# Patient Record
Sex: Female | Born: 1985 | Race: White | Hispanic: No | Marital: Married | Smoking: Former smoker
Health system: Southern US, Community
[De-identification: ages and names within clinical notes are randomized; demographics above are authoritative.]

## PROBLEM LIST (undated history)

## (undated) ENCOUNTER — Inpatient Hospital Stay (HOSPITAL_COMMUNITY): Payer: Self-pay

## (undated) DIAGNOSIS — O34219 Maternal care for unspecified type scar from previous cesarean delivery: Secondary | ICD-10-CM

## (undated) DIAGNOSIS — K219 Gastro-esophageal reflux disease without esophagitis: Secondary | ICD-10-CM

## (undated) DIAGNOSIS — O42919 Preterm premature rupture of membranes, unspecified as to length of time between rupture and onset of labor, unspecified trimester: Secondary | ICD-10-CM

## (undated) DIAGNOSIS — T7840XA Allergy, unspecified, initial encounter: Secondary | ICD-10-CM

## (undated) DIAGNOSIS — O4590 Premature separation of placenta, unspecified, unspecified trimester: Secondary | ICD-10-CM

## (undated) DIAGNOSIS — Q792 Exomphalos: Secondary | ICD-10-CM

## (undated) DIAGNOSIS — O321XX0 Maternal care for breech presentation, not applicable or unspecified: Secondary | ICD-10-CM

## (undated) HISTORY — DX: Allergy, unspecified, initial encounter: T78.40XA

## (undated) HISTORY — DX: Gastro-esophageal reflux disease without esophagitis: K21.9

## (undated) HISTORY — PX: MOUTH SURGERY: SHX715

## (undated) HISTORY — PX: NO PAST SURGERIES: SHX2092

---
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
## 2004-01-14 ENCOUNTER — Other Ambulatory Visit: Admission: RE | Admit: 2004-01-14 | Discharge: 2004-01-14 | Payer: Self-pay | Admitting: Internal Medicine

## 2005-03-08 ENCOUNTER — Other Ambulatory Visit: Admission: RE | Admit: 2005-03-08 | Discharge: 2005-03-08 | Payer: Self-pay | Admitting: Internal Medicine

## 2006-03-10 ENCOUNTER — Other Ambulatory Visit: Admission: RE | Admit: 2006-03-10 | Discharge: 2006-03-10 | Payer: Self-pay | Admitting: Internal Medicine

## 2007-03-08 ENCOUNTER — Other Ambulatory Visit: Admission: RE | Admit: 2007-03-08 | Discharge: 2007-03-08 | Payer: Self-pay | Admitting: Internal Medicine

## 2009-10-15 ENCOUNTER — Encounter: Admission: RE | Admit: 2009-10-15 | Discharge: 2009-10-15 | Payer: Self-pay | Admitting: Internal Medicine

## 2009-10-16 ENCOUNTER — Other Ambulatory Visit: Admission: RE | Admit: 2009-10-16 | Discharge: 2009-10-16 | Payer: Self-pay | Admitting: Internal Medicine

## 2010-11-16 ENCOUNTER — Encounter: Payer: Self-pay | Admitting: Gastroenterology

## 2010-12-25 ENCOUNTER — Encounter: Payer: Self-pay | Admitting: Gastroenterology

## 2010-12-25 ENCOUNTER — Ambulatory Visit (INDEPENDENT_AMBULATORY_CARE_PROVIDER_SITE_OTHER): Payer: BC Managed Care – PPO | Admitting: Gastroenterology

## 2010-12-25 VITALS — BP 100/76 | HR 60 | Ht 62.0 in | Wt 112.6 lb

## 2010-12-25 DIAGNOSIS — K219 Gastro-esophageal reflux disease without esophagitis: Secondary | ICD-10-CM

## 2010-12-25 NOTE — Patient Instructions (Addendum)
Stay on dexilant, more samples given. Start pepcid or zantac at bedtime. You will be set up for an upper endoscopy. A copy of this information will be made available to Dr. Marisue Brooklyn.
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
## 2011-01-19 ENCOUNTER — Encounter: Payer: Self-pay | Admitting: Gastroenterology

## 2011-01-19 ENCOUNTER — Ambulatory Visit (AMBULATORY_SURGERY_CENTER): Payer: BC Managed Care – PPO | Admitting: Gastroenterology

## 2011-01-19 VITALS — BP 153/73 | HR 72 | Temp 97.8°F | Resp 16 | Ht 62.0 in | Wt 112.0 lb

## 2011-01-19 DIAGNOSIS — K2990 Gastroduodenitis, unspecified, without bleeding: Secondary | ICD-10-CM

## 2011-01-19 DIAGNOSIS — A048 Other specified bacterial intestinal infections: Secondary | ICD-10-CM

## 2011-01-19 DIAGNOSIS — K2940 Chronic atrophic gastritis without bleeding: Secondary | ICD-10-CM

## 2011-01-19 DIAGNOSIS — K219 Gastro-esophageal reflux disease without esophagitis: Secondary | ICD-10-CM

## 2011-01-19 DIAGNOSIS — K2970 Gastritis, unspecified, without bleeding: Secondary | ICD-10-CM

## 2011-01-19 MED ORDER — SODIUM CHLORIDE 0.9 % IV SOLN: 500.0000 mL | INTRAVENOUS | Status: DC

## 2011-01-19 NOTE — Patient Instructions (Signed)
Please review discharge instructions  Please continue Dexilant every morning.  Ok to stop nightly Pepcid/Zantac since you haven't noticed any difference

## 2011-01-20 ENCOUNTER — Telehealth: Payer: Self-pay | Admitting: *Deleted

## 2011-01-20 NOTE — Telephone Encounter (Signed)

## 2011-01-26 ENCOUNTER — Other Ambulatory Visit: Payer: Self-pay | Admitting: Gastroenterology

## 2011-01-26 DIAGNOSIS — A048 Other specified bacterial intestinal infections: Secondary | ICD-10-CM

## 2011-01-26 MED ORDER — AMOXICILL-CLARITHRO-LANSOPRAZ PO MISC: Freq: Two times a day (BID) | ORAL | Status: DC

## 2011-01-27 ENCOUNTER — Telehealth: Payer: Self-pay | Admitting: Gastroenterology

## 2011-01-27 MED ORDER — CLARITHROMYCIN 500 MG PO TABS: 500.0000 mg | ORAL_TABLET | Freq: Two times a day (BID) | ORAL | Status: AC

## 2011-01-27 MED ORDER — AMOXICILLIN 500 MG PO CAPS: 1000.0000 mg | ORAL_CAPSULE | Freq: Two times a day (BID) | ORAL | Status: AC

## 2011-01-27 NOTE — Telephone Encounter (Signed)
Great, thanks

## 2011-01-27 NOTE — Telephone Encounter (Signed)
Pt needs to have alternative to prev pac because of cost, 500 mg biaxin and 1000 mg amox both bid for ten days she will also take dexilant as well.  Allergies verified sent to pharmacy

## 2011-03-16 ENCOUNTER — Ambulatory Visit: Payer: BC Managed Care – PPO | Admitting: Gastroenterology

## 2011-11-17 ENCOUNTER — Other Ambulatory Visit: Payer: Self-pay

## 2011-11-17 ENCOUNTER — Other Ambulatory Visit (HOSPITAL_COMMUNITY)
Admission: RE | Admit: 2011-11-17 | Discharge: 2011-11-17 | Disposition: A | Discharge status: Home / Self Care | Payer: BC Managed Care – PPO | Source: Ambulatory Visit | Attending: Internal Medicine | Admitting: Internal Medicine

## 2011-11-17 DIAGNOSIS — Z01419 Encounter for gynecological examination (general) (routine) without abnormal findings: Secondary | ICD-10-CM | POA: Insufficient documentation

## 2012-12-19 ENCOUNTER — Other Ambulatory Visit: Payer: Self-pay

## 2013-10-01 ENCOUNTER — Encounter: Payer: Self-pay | Admitting: *Deleted

## 2013-10-01 DIAGNOSIS — T7840XA Allergy, unspecified, initial encounter: Secondary | ICD-10-CM | POA: Insufficient documentation

## 2013-10-01 DIAGNOSIS — K219 Gastro-esophageal reflux disease without esophagitis: Secondary | ICD-10-CM | POA: Insufficient documentation

## 2013-10-02 ENCOUNTER — Ambulatory Visit: Payer: Self-pay | Admitting: Emergency Medicine

## 2013-10-03 ENCOUNTER — Ambulatory Visit (INDEPENDENT_AMBULATORY_CARE_PROVIDER_SITE_OTHER): Payer: BC Managed Care – PPO | Admitting: Emergency Medicine

## 2013-10-03 ENCOUNTER — Encounter: Payer: Self-pay | Admitting: Emergency Medicine

## 2013-10-03 VITALS — BP 118/70 | HR 116 | Temp 98.6°F | Resp 18 | Ht 62.5 in | Wt 124.0 lb

## 2013-10-03 DIAGNOSIS — N926 Irregular menstruation, unspecified: Secondary | ICD-10-CM

## 2013-10-03 DIAGNOSIS — L919 Hypertrophic disorder of the skin, unspecified: Secondary | ICD-10-CM

## 2013-10-03 DIAGNOSIS — L909 Atrophic disorder of skin, unspecified: Secondary | ICD-10-CM

## 2013-10-03 DIAGNOSIS — R239 Unspecified skin changes: Secondary | ICD-10-CM

## 2013-10-03 DIAGNOSIS — L918 Other hypertrophic disorders of the skin: Secondary | ICD-10-CM

## 2013-10-03 DIAGNOSIS — R238 Other skin changes: Secondary | ICD-10-CM

## 2013-10-03 NOTE — Patient Instructions (Signed)
Prenatal Care  WHAT IS PRENATAL CARE?  Prenatal care means health care during your pregnancy, before your baby is born. It is very important to take care of yourself and your baby during your pregnancy by:   Getting early prenatal care. If you know you are pregnant, or think you might be pregnant, call your health care provider as soon as possible. Schedule a visit for a prenatal exam.  Getting regular prenatal care. Follow your health care provider's schedule for blood and other necessary tests. Do not miss appointments.  Doing everything you can to keep yourself and your baby healthy during your pregnancy.  Getting complete care. Prenatal care should include evaluation of the medical, dietary, educational, psychological, and social needs of you and your significant other. The medical and genetic history of your family and the family of your baby's father should be discussed with your health care provider.  Discussing with your health care provider:  Prescription, over-the-counter, and herbal medicines that you take.  Any history of substance abuse, alcohol use, smoking, and illegal drug use.  Any history of domestic abuse and violence.  Immunizations you have received.  Your nutrition and diet.  The amount of exercise you do.  Any environmental and occupational hazards to which you are exposed.  History of sexually transmitted infections for both you and your partner.  Previous pregnancies you have had. WHY IS PRENATAL CARE SO IMPORTANT?  By regularly seeing your health care provider, you help ensure that problems can be identified early so that they can be treated as soon as possible. Other problems might be prevented. Many studies have shown that early and regular prenatal care is important for the health of mothers and their babies.  HOW CAN I TAKE CARE OF MYSELF WHILE I AM PREGNANT?  Here are ways to take care of yourself and your baby:   Start or continue taking your  multivitamin with 400 micrograms (mcg) of folic acid every day.  Get early and regular prenatal care. It is very important to see a health care provider during your pregnancy. Your health care provider will check at each visit to make sure that you and the baby are healthy. If there are any problems, action can be taken right away to help you and the baby.  Eat a healthy diet that includes:  Fruits.  Vegetables.  Foods low in saturated fat.  Whole grains.  Calcium-rich foods, such as milk, yogurt, and hard cheeses.  Drink 6 to 8 glasses of liquids a day.  Unless your health care provider tells you not to, try to be physically active for 30 minutes, most days of the week. If you are pressed for time, you can get your activity in through 10-minute segments, three times a day.  Do not smoke, drink alcohol, or use drugs. These can cause long-term damage to your baby. Talk with your health care provider about steps to take to stop smoking. Talk with a member of your faith community, a counselor, a trusted friend, or your health care provider if you are concerned about your alcohol or drug use.  Ask your health care provider before taking any medicine, even over-the-counter medicines. Some medicines are not safe to take during pregnancy.  Get plenty of rest and sleep.  Avoid hot tubs and saunas during pregnancy.  Do not have X-rays taken unless absolutely necessary and with the recommendation of your health care provider. A lead shield can be placed on your abdomen to protect the  baby when X-rays are taken in other parts of the body.  Do not empty the cat litter when you are pregnant. It may contain a parasite that causes an infection called toxoplasmosis, which can cause birth defects. Also, use gloves when working in garden areas used by cats.  Do not eat uncooked or undercooked meats or fish.  Do not eat soft, mold-ripened cheeses (Brie, Camembert, and chevre) or soft, blue-veined  cheese (Danish blue and Roquefort).  Stay away from toxic chemicals like:  Insecticides.  Solvents (some cleaners or paint thinners).  Lead.  Mercury.  Sexual intercourse may continue until the end of the pregnancy, unless you have a medical problem or there is a problem with the pregnancy and your health care provider tells you not to.  Do not wear high-heel shoes, especially during the second half of the pregnancy. You can lose your balance and fall.  Do not take long trips, unless absolutely necessary. Be sure to see your health care provider before going on the trip.  Do not sit in one position for more than 2 hours when on a trip.  Take a copy of your medical records when going on a trip. Know where a hospital is located in the city you are visiting, in case of an emergency.  Most dangerous household products will have pregnancy warnings on their labels. Ask your health care provider about products if you are unsure.  Limit or eliminate your caffeine intake from coffee, tea, sodas, medicines, and chocolate.  Many women continue working through pregnancy. Staying active might help you stay healthier. If you have a question about the safety or the hours you work at your particular job, talk with your health care provider.  Get informed:  Read books.  Watch videos.  Go to childbirth classes for you and your significant other.  Talk with experienced moms.  Ask your health care provider about childbirth education classes for you and your partner. Classes can help you and your partner prepare for the birth of your baby.  Ask about a baby doctor (pediatrician) and methods and pain medicine for labor, delivery, and possible cesarean delivery. HOW OFTEN SHOULD I SEE MY HEALTH CARE PROVIDER DURING PREGNANCY?  Your health care provider will give you a schedule for your prenatal visits. You will have visits more often as you get closer to the end of your pregnancy. An average  pregnancy lasts about 40 weeks.  A typical schedule includes visiting your health care provider:   About once each month during your first 6 months of pregnancy.  Every 2 weeks during the next 2 months.  Weekly in the last month, until the delivery date. Your health care provider will probably want to see you more often if:  You are older than 35 years.  Your pregnancy is high risk because you have certain health problems or problems with the pregnancy, such as:  Diabetes.  High blood pressure.  The baby is not growing on schedule, according to the dates of the pregnancy. Your health care provider will do special tests to make sure you and the baby are not having any serious problems. WHAT HAPPENS DURING PRENATAL VISITS?   At your first prenatal visit, your health care provider will do a physical exam and talk to you about your health history and the health history of your partner and your family. Your health care provider will be able to tell you what date to expect your baby to be born on.  Your first physical exam will include checks of your blood pressure, measurements of your height and weight, and an exam of your pelvic organs. Your health care provider will do a Pap test if you have not had one recently and will do cultures of your cervix to make sure there is no infection.  At each prenatal visit, there will be tests of your blood, urine, blood pressure, weight, and checking the progress of the baby.  At your later prenatal visits, your health care provider will check how you are doing and how the baby is developing. You may have a number of tests done as your pregnancy progresses.  Ultrasound exams are often used to check on the baby's growth and health.  You may have more urine and blood tests, as well as special tests, if needed. These may include amniocentesis to examine fluid in the pregnancy sac, stress tests to check how the baby responds to contractions, or a  biophysical profile to measure fetus well-being. Your health care provider will explain the tests and why they are necessary.  You should discuss with your health care provider your plans to breastfeed or bottle-feed your baby.  Each visit is also a chance for you to learn about staying healthy during pregnancy and to ask questions. Document Released: 06/17/2003 Document Revised: 04/04/2013 Document Reviewed: 11/30/2012 [facility] Patient Information 2014 [place], [place]. Wound Care Wound care helps prevent pain and infection.  You may need a tetanus shot if:  You cannot remember when you had your last tetanus shot.  You have never had a tetanus shot.  The injury broke your skin. If you need a tetanus shot and you choose not to have one, you may get tetanus. Sickness from tetanus can be serious. HOME CARE   Only take medicine as told by your doctor.  Clean the wound daily with mild soap and water.  Change any bandages (dressings) as told by your doctor.  Put medicated cream and a bandage on the wound as told by your doctor.  Change the bandage if it gets wet, dirty, or starts to smell.  Take showers. Do not take baths, swim, or do anything that puts your wound under water.  Rest and raise (elevate) the wound until the pain and puffiness (swelling) are better.  Keep all doctor visits as told. GET HELP RIGHT AWAY IF:   Yellowish-white fluid (pus) comes from the wound.  Medicine does not lessen your pain.  There is a red streak going away from the wound.  You have a fever. MAKE SURE YOU:   Understand these instructions.  Will watch your condition.  Will get help right away if you are not doing well or get worse. Document Released: 03/23/2008 Document Revised: 09/06/2011 Document Reviewed: 10/18/2010 [facility] Patient Information 2014 [place], [place].

## 2013-10-03 NOTE — Progress Notes (Signed)
   Subjective:    Patient ID: Laura Barry, female    DOB: 10-27-1985, 28 y.o.   MRN: [id number]  HPI Comments: 28 yo female presents needing skin tags removed. She has multiple skin tags that have grown and are more irritated with clothing. She notes occasionally pain with certain clothes and sitting/ lying down due to increased size of tags.   She also wants to discuss stopping OCP and trying to get pregnant. She finished last pak of OCP 09/19/13 and has not had GYN evaluation but has not missed any cycles yet.     Medication List    Notice As of 10/03/2013 11:59 PM   You have not been prescribed any medications.     Allergies  Allergen Reactions  . Phenergan [Promethazine Hcl]     rash  . Prilosec [Omeprazole]    Past Medical History  Diagnosis Date  . Allergic rhinitis   . Allergy   . GERD (gastroesophageal reflux disease)      Review of Systems  Skin: Positive for wound.  All other systems reviewed and are negative.  BP 118/70  Pulse 116  Temp(Src) 98.6 F (37 C) (Temporal)  Resp 18  Ht 5' 2.5" (1.588 m)  Wt 124 lb (56.246 kg)  BMI 22.30 kg/m2  LMP 09/19/2013     Objective:   Physical Exam  Nursing note and vitals reviewed. Constitutional: She is oriented to person, place, and time. She appears well-developed and well-nourished.  HENT:  Head: Normocephalic and atraumatic.  Right Ear: External ear normal.  Left Ear: External ear normal.  Nose: Nose normal.  Eyes: Conjunctivae and EOM are normal.  Neck: Normal range of motion.  Cardiovascular: Normal rate, regular rhythm, normal heart sounds and intact distal pulses.   Pulmonary/Chest: Effort normal and breath sounds normal.  Musculoskeletal: Normal range of motion.  Lymphadenopathy:    She has no cervical adenopathy.  Neurological: She is alert and oriented to person, place, and time.  Skin: Skin is warm and dry.  Right posterior shoulder in bra line 6 mm floppy skin tag with mild irritation and old  blood. Left medial thigh at panty line 6  mm floppy skin tag with mild irritation and old blood. Mid upper back right and left shoulder blade with med dark flat mild irreg borders 3 -4mm    Psychiatric: She has a normal mood and affect. Judgment normal.          Assessment & Plan:  1.  Irreg Nevi- monitor for any change, call if occurs for removal 2.  Irreg/ atypical skin tags removal- Verbal permission obtained. Areas prepped with alcohol. 1% lidocaine used at each area approx 1 cc ea. Electrocautery excision performed with clear borders obtained. Area bandaged with Neosporin/ guaze/ paper tape. Wound hygiene given. SX of infection explained pt w/c with any concerns.  3. Pregnancy education given-Trying to get pregnant?-Vitamins/ dietary education given. Names of GYN offices given to establish care.

## 2014-03-09 ENCOUNTER — Inpatient Hospital Stay (HOSPITAL_COMMUNITY): Payer: BC Managed Care – PPO

## 2014-03-09 ENCOUNTER — Encounter (HOSPITAL_COMMUNITY): Payer: Self-pay

## 2014-03-09 ENCOUNTER — Inpatient Hospital Stay (HOSPITAL_COMMUNITY)
Admission: AD | Admit: 2014-03-09 | Discharge: 2014-03-09 | Disposition: A | Discharge status: Home / Self Care | Payer: BC Managed Care – PPO | Source: Ambulatory Visit | Attending: Obstetrics and Gynecology | Admitting: Obstetrics and Gynecology

## 2014-03-09 DIAGNOSIS — K219 Gastro-esophageal reflux disease without esophagitis: Secondary | ICD-10-CM | POA: Insufficient documentation

## 2014-03-09 DIAGNOSIS — O4690 Antepartum hemorrhage, unspecified, unspecified trimester: Secondary | ICD-10-CM

## 2014-03-09 DIAGNOSIS — O26859 Spotting complicating pregnancy, unspecified trimester: Secondary | ICD-10-CM | POA: Diagnosis present

## 2014-03-09 DIAGNOSIS — O418X10 Other specified disorders of amniotic fluid and membranes, first trimester, not applicable or unspecified: Secondary | ICD-10-CM

## 2014-03-09 DIAGNOSIS — O200 Threatened abortion: Secondary | ICD-10-CM | POA: Insufficient documentation

## 2014-03-09 DIAGNOSIS — Z87891 Personal history of nicotine dependence: Secondary | ICD-10-CM | POA: Insufficient documentation

## 2014-03-09 DIAGNOSIS — O468X1 Other antepartum hemorrhage, first trimester: Secondary | ICD-10-CM

## 2014-03-09 LAB — URINALYSIS, ROUTINE W REFLEX MICROSCOPIC
Bilirubin Urine: NEGATIVE
GLUCOSE, UA: NEGATIVE mg/dL
KETONES UR: NEGATIVE mg/dL
LEUKOCYTES UA: NEGATIVE
NITRITE: NEGATIVE
PH: 7 (ref 5.0–8.0)
Protein, ur: NEGATIVE mg/dL
Urobilinogen, UA: 0.2 mg/dL (ref 0.0–1.0)

## 2014-03-09 LAB — CBC
HCT: 39.9 % (ref 36.0–46.0)
Hemoglobin: 14.2 g/dL (ref 12.0–15.0)
MCH: 31.3 pg (ref 26.0–34.0)
MCHC: 35.6 g/dL (ref 30.0–36.0)
MCV: 88.1 fL (ref 78.0–100.0)
PLATELETS: 271 10*3/uL (ref 150–400)
RBC: 4.53 MIL/uL (ref 3.87–5.11)
RDW: 11.9 % (ref 11.5–15.5)
WBC: 9.4 10*3/uL (ref 4.0–10.5)

## 2014-03-09 LAB — URINE MICROSCOPIC-ADD ON

## 2014-03-09 LAB — HCG, QUANTITATIVE, PREGNANCY: HCG, BETA CHAIN, QUANT, S: 18951 m[IU]/mL — AB (ref <5)

## 2014-03-09 LAB — ABO/RH: ABO/RH(D): O POS

## 2014-03-09 NOTE — MAU Note (Signed)
Pt in lab currently

## 2014-03-09 NOTE — MAU Note (Signed)
At a friend's house, started having Vaginal bleeding 2 hours ago- bright red- saw this while voiding.  Then saw just a few drops on toliet paper at home.  And then happened again while voiding here.  Amount of bleeding seemed less than first time.  Abd area felt funny but unsure if these are cramps.

## 2014-03-09 NOTE — Discharge Instructions (Signed)
Pelvic Rest Pelvic rest is sometimes recommended for women when:   The placenta is partially or completely covering the opening of the cervix (placenta previa).  There is bleeding between the uterine wall and the amniotic sac in the first trimester (subchorionic hemorrhage).  The cervix begins to open without labor starting (incompetent cervix, cervical insufficiency).  The labor is too early (preterm labor). HOME CARE INSTRUCTIONS  Do not have sexual intercourse, stimulation, or an orgasm.  Do not use tampons, douche, or put anything in the vagina.  Do not lift anything over 10 pounds (4.5 kg).  Avoid strenuous activity or straining your pelvic muscles. SEEK MEDICAL CARE IF:  You have any vaginal bleeding during pregnancy. Treat this as a potential emergency.  You have cramping pain felt low in the stomach (stronger than menstrual cramps).  You notice vaginal discharge (watery, mucus, or bloody).  You have a low, dull backache.  There are regular contractions or uterine tightening. SEEK IMMEDIATE MEDICAL CARE IF: You have vaginal bleeding and have placenta previa.  Document Released: 10/09/2010 Document Revised: 09/06/2011 Document Reviewed: 10/09/2010 [facility] Patient Information 2015 [place], [place]. This information is not intended to replace advice given to you by your health care provider. Make sure you discuss any questions you have with your health care provider.  Vaginal Bleeding During Pregnancy, First Trimester A small amount of bleeding (spotting) from the vagina is relatively common in early pregnancy. It usually stops on its own. Various things may cause bleeding or spotting in early pregnancy. Some bleeding may be related to the pregnancy, and some may not. In most cases, the bleeding is normal and is not a problem. However, bleeding can also be a sign of something serious. Be sure to tell your health care provider about any vaginal bleeding right away. Some  possible causes of vaginal bleeding during the first trimester include:  Infection or inflammation of the cervix.  Growths (polyps) on the cervix.  Miscarriage or threatened miscarriage.  Pregnancy tissue has developed outside of the uterus and in a fallopian tube (tubal pregnancy).  Tiny cysts have developed in the uterus instead of pregnancy tissue (molar pregnancy). HOME CARE INSTRUCTIONS  Watch your condition for any changes. The following actions may help to lessen any discomfort you are feeling:  Follow your health care provider's instructions for limiting your activity. If your health care provider orders bed rest, you may need to stay in bed and only get up to use the bathroom. However, your health care provider may allow you to continue light activity.  If needed, make plans for someone to help with your regular activities and responsibilities while you are on bed rest.  Keep track of the number of pads you use each day, how often you change pads, and how soaked (saturated) they are. Write this down.  Do not use tampons. Do not douche.  Do not have sexual intercourse or orgasms until approved by your health care provider.  If you pass any tissue from your vagina, save the tissue so you can show it to your health care provider.  Only take over-the-counter or prescription medicines as directed by your health care provider.  Do not take aspirin because it can make you bleed.  Keep all follow-up appointments as directed by your health care provider. SEEK MEDICAL CARE IF:  You have any vaginal bleeding during any part of your pregnancy.  You have cramps or labor pains.  You have a fever, not controlled by medicine. SEEK IMMEDIATE  MEDICAL CARE IF:   You have severe cramps in your back or belly (abdomen).  You pass large clots or tissue from your vagina.  Your bleeding increases.  You feel light-headed or weak, or you have fainting episodes.  You have chills.  You  are leaking fluid or have a gush of fluid from your vagina.  You pass out while having a bowel movement. MAKE SURE YOU:  Understand these instructions.  Will watch your condition.  Will get help right away if you are not doing well or get worse. Document Released: 03/24/2005 Document Revised: 06/19/2013 Document Reviewed: 02/19/2013 [facility] Patient Information 2015 [place], [place]. This information is not intended to replace advice given to you by your health care provider. Make sure you discuss any questions you have with your health care provider.  Subchorionic Hematoma A subchorionic hematoma is a gathering of blood between the outer wall of the placenta and the inner wall of the womb (uterus). The placenta is the organ that connects the fetus to the wall of the uterus. The placenta performs the feeding, breathing (oxygen to the fetus), and waste removal (excretory work) of the fetus.  Subchorionic hematoma is the most common abnormality found on a result from ultrasonography done during the first trimester or early second trimester of pregnancy. If there has been little or no vaginal bleeding, early small hematomas usually shrink on their own and do not affect your baby or pregnancy. The blood is gradually absorbed over 1-2 weeks. When bleeding starts later in pregnancy or the hematoma is larger or occurs in an older pregnant woman, the outcome may not be as good. Larger hematomas may get bigger, which increases the chances for miscarriage. Subchorionic hematoma also increases the risk of premature detachment of the placenta from the uterus, preterm (premature) labor, and stillbirth. HOME CARE INSTRUCTIONS  Stay on bed rest if your health care provider recommends this. Although bed rest will not prevent more bleeding or prevent a miscarriage, your health care provider may recommend bed rest until you are advised otherwise.  Avoid heavy lifting (more than 10 lb [4.5 kg]), exercise, sexual  intercourse, or douching as directed by your health care provider.  Keep track of the number of pads you use each day and how soaked (saturated) they are. Write down this information.  Do not use tampons.  Keep all follow-up appointments as directed by your health care provider. Your health care provider may ask you to have follow-up blood tests or ultrasound tests or both. SEEK IMMEDIATE MEDICAL CARE IF:  You have severe cramps in your stomach, back, abdomen, or pelvis.  You have a fever.  You pass large clots or tissue. Save any tissue for your health care provider to look at.  Your bleeding increases or you become lightheaded, feel weak, or have fainting episodes. Document Released: 09/29/2006 Document Revised: 10/29/2013 Document Reviewed: 01/11/2013 [facility] Patient Information 2015 [place], [place]. This information is not intended to replace advice given to you by your health care provider. Make sure you discuss any questions you have with your health care provider. Human Chorionic Gonadotropin (hCG) This is a test to confirm and monitor pregnancy or to diagnose trophoblastic disease or germ cell tumors. As early as 10 days after a missed menstrual period (some methods can detect hCG even earlier, at one week after conception) or if your caregiver thinks that your symptoms suggest ectopic pregnancy, a failing pregnancy, trophoblastic disease, or germ cell tumors. hCG is a protein produced in the  placenta of a pregnant woman. A pregnancy test is a specific blood or urine test that can detect hCG and confirm pregnancy. This hormone is able to be detected 10 days after a missed menstrual period, the time period when the fertilized egg is implanted in the woman's uterus. With some methods, hCG can be detected even earlier, at one week after conception.  During the early weeks of pregnancy, hCG is important in maintaining function of the corpus luteum (the mass of cells that forms from a mature  egg). Production of hCG increases steadily during the first trimester (8-10 weeks), peaking around the 10th week after the last menstrual cycle. Levels then fall slowly during the remainder of the pregnancy. hCG is no longer detectable within a few weeks after delivery. hCG is also produced by some germ cell tumors and increased levels are seen in trophoblastic disease. SAMPLE COLLECTION hCG is commonly detected in urine. The preferred specimen is a random urine sample collected first thing in the morning. hCG can also be measured in blood drawn from a vein in the arm. NORMAL FINDINGS Qualitative:negative in non-pregnant women; positive in pregnancy Quantitative:   Gestation less than 1 week: 5-50 Whole HCG (milli-international units/mL)  Gestation of 2 weeks: 50-500 Whole HCG (milli-international units/mL)  Gestation of 3 weeks: 100-10,000 Whole HCG (milli-international units/mL)  Gestation of 4 weeks: 1,000-30,000 Whole HCG (milli-international units/mL)  Gestation of 5 weeks 3,500-115,000 Whole HCG (milli-international units/mL)  Gestation of 6-8 weeks: 12,000-270,000 Whole HCG (milli-international units/mL)  Gestation of 12 weeks: 15,000-220,000 Whole HCG (milli-international units/mL)  Males and non-pregnant females: less than 5 Whole HCG (milli-international units/mL) Beta subunit: depends on the method and test used Ranges for normal findings may vary among different laboratories and hospitals. You should always check with your doctor after having lab work or other tests done to discuss the meaning of your test results and whether your values are considered within normal limits. MEANING OF TEST  Your caregiver will go over the test results with you and discuss the importance and meaning of your results, as well as treatment options and the need for additional tests if necessary. OBTAINING THE TEST RESULTS It is your responsibility to obtain your test results. Ask the lab or  department performing the test when and how you will get your results. Document Released: 07/16/2004 Document Revised: 09/06/2011 Document Reviewed: 09/17/2013 [facility] Patient Information 2015 [place], [place]. This information is not intended to replace advice given to you by your health care provider. Make sure you discuss any questions you have with your health care provider.  Threatened Miscarriage A threatened miscarriage occurs when you have vaginal bleeding during your first 20 weeks of pregnancy but the pregnancy has not ended. If you have vaginal bleeding during this time, your health care provider will do tests to make sure you are still pregnant. If the tests show you are still pregnant and the developing baby (fetus) inside your womb (uterus) is still growing, your condition is considered a threatened miscarriage. A threatened miscarriage does not mean your pregnancy will end, but it does increase the risk of losing your pregnancy (complete miscarriage). CAUSES  The cause of a threatened miscarriage is usually not known. If you go on to have a complete miscarriage, the most common cause is an abnormal number of chromosomes in the developing baby. Chromosomes are the structures inside cells that hold all your genetic material. Some causes of vaginal bleeding that do not result in miscarriage include:  Having sex.  Having an infection.  Normal hormone changes of pregnancy.  Bleeding that occurs when an egg implants in your uterus. RISK FACTORS Risk factors for bleeding in early pregnancy include:  Obesity.  Smoking.  Drinking excessive amounts of alcohol or caffeine.  Recreational drug use. SIGNS AND SYMPTOMS  Light vaginal bleeding.  Mild abdominal pain or cramps. DIAGNOSIS  If you have bleeding with or without abdominal pain before 20 weeks of pregnancy, your health care provider will do tests to check whether you are still pregnant. One important test involves using  sound waves and a computer (ultrasound) to create images of the inside of your uterus. Other tests include an internal exam of your vagina and uterus (pelvic exam) and measurement of your baby's heart rate.  You may be diagnosed with a threatened miscarriage if:  Ultrasound testing shows you are still pregnant.  Your baby's heart rate is strong.  A pelvic exam shows that the opening between your uterus and your vagina (cervix) is closed.  Your heart rate and blood pressure are stable.  Blood tests confirm you are still pregnant. TREATMENT  No treatments have been shown to prevent a threatened miscarriage from going on to a complete miscarriage. However, the right home care is important.  HOME CARE INSTRUCTIONS   Make sure you keep all your appointments for prenatal care. This is very important.  Get plenty of rest.  Do not have sex or use tampons if you have vaginal bleeding.  Do not douche.  Do not smoke or use recreational drugs.  Do not drink alcohol.  Avoid caffeine. SEEK MEDICAL CARE IF:  You have light vaginal bleeding or spotting while pregnant.  You have abdominal pain or cramping.  You have a fever. SEEK IMMEDIATE MEDICAL CARE IF:  You have heavy vaginal bleeding.  You have blood clots coming from your vagina.  You have severe low back pain or abdominal cramps.  You have fever, chills, and severe abdominal pain. MAKE SURE YOU:  Understand these instructions.  Will watch your condition.  Will get help right away if you are not doing well or get worse. Document Released: 06/14/2005 Document Revised: 06/19/2013 Document Reviewed: 04/10/2013 [facility] Patient Information 2015 [place], [place]. This information is not intended to replace advice given to you by your health care provider. Make sure you discuss any questions you have with your health care provider.

## 2014-03-09 NOTE — MAU Provider Note (Signed)
History     CSN: [id number]  Arrival date and time: 03/09/14 0028 Orders placed in EPIC: 0030 Provider 1st notified: 0111 Provider 2nd notified: 0150 Provider at bedside: 0155     Chief Complaint  Patient presents with  . Vaginal Bleeding   HPI  Ms. Laura Barry is a 28 yo G1P0 female at [redacted] wks gestation with complaints of noticing spotting after urination tonight.  She noticed a little more that dripped in the toilet.  She denies pain, but states that her "belly just feels weird".  She has not had her first prenatal appointment with [facility].  She is scheduled for that on 03/18/2014.  Past Medical History  Diagnosis Date  . Allergic rhinitis   . Allergy   . GERD (gastroesophageal reflux disease)     Past Surgical History  Procedure Laterality Date  . Mouth surgery    . No past surgeries      Family History  Problem Relation Age of Onset  . Hypertension    . Stroke    . Cancer    . Hyperlipidemia    . Anxiety disorder    . Diabetes Maternal Grandfather   . Hypertension Father   . Anxiety disorder Father     History  Substance Use Topics  . Smoking status: Former Games developer  . Smokeless tobacco: Never Used  . Alcohol Use: No    Allergies:  Allergies  Allergen Reactions  . Phenergan [Promethazine Hcl]     rash    Facility-administered medications prior to admission  Medication Dose Route Frequency Provider Last Rate Last Dose  . 0.9 %  sodium chloride infusion  500 mL Intravenous Continuous Rachael Fee, MD       Prescriptions prior to admission  Medication Sig Dispense Refill  . Prenatal Vit-Fe Fumarate-FA (PRENATAL MULTIVITAMIN) TABS tablet Take 1 tablet by mouth daily at 12 noon.        Review of Systems  Constitutional: Negative.   HENT: Negative.   Eyes: Negative.   Respiratory: Negative.   Cardiovascular: Negative.   Gastrointestinal: Negative.   Genitourinary:       Small amount of vaginal bleeding  Musculoskeletal: Negative.   Skin:  Negative.   Neurological: Negative.   Endo/Heme/Allergies: Negative.   Psychiatric/Behavioral: Negative.    Results for orders placed during the hospital encounter of 03/09/14 (from the past 24 hour(s))  ABO/RH     Status: None   Collection Time    03/09/14 12:30 AM      Result Value Ref Range   ABO/RH(D) O POS    HCG, QUANTITATIVE, PREGNANCY     Status: Abnormal   Collection Time    03/09/14 12:30 AM      Result Value Ref Range   hCG, Beta Chain, Quant, S 18951 (*) <5 mIU/mL  CBC     Status: None   Collection Time    03/09/14 12:30 AM      Result Value Ref Range   WBC 9.4  4.0 - 10.5 K/uL   RBC 4.53  3.87 - 5.11 MIL/uL   Hemoglobin 14.2  12.0 - 15.0 g/dL   HCT 40.9  81.1 - 91.4 %   MCV 88.1  78.0 - 100.0 fL   MCH 31.3  26.0 - 34.0 pg   MCHC 35.6  30.0 - 36.0 g/dL   RDW 78.2  95.6 - 21.3 %   Platelets 271  150 - 400 K/uL  URINALYSIS, ROUTINE W REFLEX MICROSCOPIC  Status: Abnormal   Collection Time    03/09/14 12:50 AM      Result Value Ref Range   Color, Urine YELLOW  YELLOW   APPearance CLEAR  CLEAR   Specific Gravity, Urine <1.005 (*) 1.005 - 1.030   pH 7.0  5.0 - 8.0   Glucose, UA NEGATIVE  NEGATIVE mg/dL   Hgb urine dipstick LARGE (*) NEGATIVE   Bilirubin Urine NEGATIVE  NEGATIVE   Ketones, ur NEGATIVE  NEGATIVE mg/dL   Protein, ur NEGATIVE  NEGATIVE mg/dL   Urobilinogen, UA 0.2  0.0 - 1.0 mg/dL   Nitrite NEGATIVE  NEGATIVE   Leukocytes, UA NEGATIVE  NEGATIVE  URINE MICROSCOPIC-ADD ON     Status: Abnormal   Collection Time    03/09/14 12:50 AM      Result Value Ref Range   Squamous Epithelial / LPF FEW (*) RARE   WBC, UA 0-2  <3 WBC/hpf   RBC / HPF 0-2  <3 RBC/hpf   Bacteria, UA FEW (*) RARE   US Ob Comp Less 14 Wks  03/09/2014   CLINICAL DATA:  Vaginal bleeding.  EXAM: OBSTETRIC <14 WK [place] AND TRANSVAGINAL OB US  TECHNIQUE: Both transabdominal and transvaginal ultrasound examinations were performed for complete evaluation of the gestation as well as the  maternal uterus, adnexal regions, and pelvic cul-de-sac. Transvaginal technique was performed to assess early pregnancy.  COMPARISON:  None.  FINDINGS: Intrauterine gestational sac: Visualized/normal in shape.  Yolk sac:  Enlarged yolk sac versus amnion.  Embryo:  None  Cardiac Activity: N/A  Heart Rate:  N/A bpm  MSD:  15.7  mm   6 w   3  d  [place] EDC: 10/30/2014  Maternal uterus/adnexae:  Small subchorionic hemorrhage.  Normal right ovary.  Corpus luteum cyst noted.  Normal left ovary.  No free pelvic fluid.  IMPRESSION: Intrauterine gestational sac estimated at 6 weeks and 3 days gestation. Enlarged yolk sac versus amnion. No embryo.  Recommend followup quantitative beta HCG levels and repeat ultrasound examination in 10 days to 2 weeks.   Electronically Signed   By: Loralie Champagne M.D.   On: 03/09/2014 02:31    Physical Exam   Height  (1.575 m), weight 59.988 kg (132 lb 4 oz), last menstrual period 01/18/2014.  Physical Exam  Constitutional: She is oriented to person, place, and time. She appears well-developed and well-nourished.  HENT:  Head: Normocephalic and atraumatic.  Eyes: Pupils are equal, round, and reactive to light.  Neck: Normal range of motion. Neck supple.  Cardiovascular: Normal rate, regular rhythm and normal heart sounds.   Respiratory: Effort normal and breath sounds normal.  GI: Soft. Bowel sounds are normal.  Genitourinary:  SSE: scant amount of dark red blood in vaginal vault; cervix closed/thick/high/firm/posterior  Musculoskeletal: Normal range of motion.  Neurological: She is alert and oriented to person, place, and time.  Skin: Skin is warm and dry.  Psychiatric: She has a normal mood and affect. Her behavior is normal. Judgment and thought content normal.    MAU Course  Procedures CCUA CBC HCG  OB U/S <14 wks Assessment and Plan  28 yo G1P0 @ [redacted] wks gestation Bleeding during pregnancy; first trimester Threatened Miscarriage  Discharge Home Pelvic  Rest Bleeding Precautions / call the office for increase in bleeding and/or pain Follow-up Ultrasound at [facility] Friday 9/18   Raelyn Mora, Judie Petit MSN, CNM 03/09/2014, 2:06 AM

## 2014-04-29 ENCOUNTER — Encounter (HOSPITAL_COMMUNITY): Payer: Self-pay

## 2014-08-09 LAB — US OB COMP LESS 14 WKS

## 2014-08-20 ENCOUNTER — Other Ambulatory Visit (HOSPITAL_COMMUNITY): Payer: Self-pay | Admitting: Certified Nurse Midwife

## 2014-08-20 DIAGNOSIS — IMO0001 Reserved for inherently not codable concepts without codable children: Secondary | ICD-10-CM

## 2014-08-20 DIAGNOSIS — O358XX9 Maternal care for other (suspected) fetal abnormality and damage, other fetus: Principal | ICD-10-CM

## 2014-08-23 ENCOUNTER — Ambulatory Visit (HOSPITAL_COMMUNITY)
Admission: RE | Admit: 2014-08-23 | Discharge: 2014-08-23 | Disposition: A | Discharge status: Home / Self Care | Payer: BC Managed Care – PPO | Source: Ambulatory Visit | Attending: Certified Nurse Midwife | Admitting: Certified Nurse Midwife

## 2014-08-23 ENCOUNTER — Other Ambulatory Visit (HOSPITAL_COMMUNITY): Payer: Self-pay | Admitting: Certified Nurse Midwife

## 2014-08-23 ENCOUNTER — Encounter (HOSPITAL_COMMUNITY): Payer: Self-pay

## 2014-08-23 VITALS — BP 114/86 | HR 97 | Wt 133.0 lb

## 2014-08-23 DIAGNOSIS — IMO0001 Reserved for inherently not codable concepts without codable children: Secondary | ICD-10-CM

## 2014-08-23 DIAGNOSIS — IMO0002 Reserved for concepts with insufficient information to code with codable children: Secondary | ICD-10-CM | POA: Insufficient documentation

## 2014-08-23 DIAGNOSIS — Z315 Encounter for genetic counseling: Secondary | ICD-10-CM | POA: Insufficient documentation

## 2014-08-23 DIAGNOSIS — O283 Abnormal ultrasonic finding on antenatal screening of mother: Secondary | ICD-10-CM | POA: Insufficient documentation

## 2014-08-23 DIAGNOSIS — Z3A14 14 weeks gestation of pregnancy: Secondary | ICD-10-CM | POA: Insufficient documentation

## 2014-08-23 DIAGNOSIS — O358XX9 Maternal care for other (suspected) fetal abnormality and damage, other fetus: Principal | ICD-10-CM

## 2014-08-23 DIAGNOSIS — O358XX0 Maternal care for other (suspected) fetal abnormality and damage, not applicable or unspecified: Secondary | ICD-10-CM | POA: Insufficient documentation

## 2014-08-23 DIAGNOSIS — Z3689 Encounter for other specified antenatal screening: Secondary | ICD-10-CM | POA: Insufficient documentation

## 2014-08-26 NOTE — Progress Notes (Addendum)
Genetic Counseling  High-Risk Gestation Note  Appointment Date:  08/23/2014 Referred By: Artelia Laroche, CNM Date of Birth:  08-28-1985 Partner:  Laura Barry   Pregnancy History: [id number] Estimated Date of Delivery: 02/17/15 Estimated Gestational Age: 36w3dAttending: MRenella Cunas MD   I met with [name] her husband, Mr. Laura Barry for genetic counseling because of abnormal ultrasound findings.  We began by reviewing the ultrasound in detail. Ultrasound performed today visualized a large fetal omphalocele. All other visualized fetal anatomy appeared normal; however, fetal heart and face views were not optimally visualized. Complete ultrasound results reported separately.     This couple was counseled that an omphalocele occurs in approximately in every 4000 births (M1:F5) and is defined as the protrusion of abdominal viscera through the umbilical ring, covered by membrane. This defect is thought to arise from failure of lateral body migration and body wall closure or from the embryonic persistence of the body stalk. We discussed that about two thirds of all cases have associated anomalies, most commonly including: cardiac defects, neural tube defects, and cleft lip with or without palate.   We reviewed the common causes of omphaloceles, including sporadic, multifactorial, and genetic etiologies. Specifically, we discussed that omphaloceles are associated with a 30-50% risk of fetal aneuploidy (trisomies 13/18), other chromosome aberrations (microdeletions, microduplications, translocations, insertions), complexes such as OEIS, and genetic syndromes including Beckwith-Wiedemann syndrome (BWS) and specific single gene conditions. We reviewed chromosomes, nondisjunction, genes, and patterns of inheritance.   They were counseled regarding the option of noninvasive prenatal screening (NIPS). Laura Barry previously had InformaSeq performed through her OB office, which was within normal  limits. We reviewed that this specifically assessed for trisomies 21, 18, and 13, and that the detection rates are reportedly 98-99% for these conditions from this screen. They understand that this did not assess for other, less common, chromosome aberrations nor single gene conditions and that it is no diagnostic for chromosome conditions. We discussed the option of a different NIPS platform (MaterniTGenome through [employer] which assesses material from all chromosomes. We discussed that this reportedly has a detection rate of approximately 95% for deletions or duplications greater than 7Mb. However, it is also not diagnostic for these conditions and will not assess for all chromosome differences nor assess for single gene conditions.   This couple was then counseled regarding the availability of amniocentesis including the associated risks, benefits, and limitations. They understand that chromosome analysis can be performed both prenatally (amniotic fluid) and postnatally (peripheral blood or cord blood). Additionally, we discussed the availability of microarray analysis, which can also be performed pre and postnatally. They were counseled that microarray analysis is a molecular based technique in which a test sample of DNA (fetal) is compared to a reference (normal) genome in order to determine if the test sample has any extra or missing genetic information. Microarray analysis allows for the detection of genetic deletions and duplications that are 1875times smaller than those identified by routine chromosome analysis. We discussed that recent publications show that approximately 6% of patients with an abnormal fetal ultrasound and a normal fetal karyotype had a significant microdeletion/microduplication detected by prenatal microarray analysis. They were then counseled that single gene conditions are typically tested for postnatally, based on the recommendation of a medical geneticist, unless ultrasound  findings or the family history are strongly suggestive of a specific syndrome. After careful consideration, the couple declined amniocentesis and follow-up NIPS (MaterniTGenome) at this time.   The patient is scheduled  to return on 09/20/14 for detailed ultrasound and is also scheduled for fetal echocardiogram with [facility] on 09/20/14. We reviewed the availability of a postnatal medical genetics evaluation, to help determine whether the child has an underlying genetic syndrome.   We discussed management and prognosis. They understand that the overall prognosis depends upon the underlying etiology and that surgical approach can vary based in part due to the size of the omphalocele. We reviewed the option of meeting with a pediatric surgeon to review the surgical approach and postnatal management. In addition, we also discussed the importance of delivering in tertiary care center, to optimize care of the newborn. The couple expressed interest in meeting later in the pregnancy with a pediatric surgeon. They planned to further consider options for place of delivery.   Both family histories were briefly reviewed. The couple declined detailed pedigree construction at this time given the nature of today's visit.  They reported no known relatives with birth defects, recurrent pregnancy loss, or known genetic conditions. 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 She is otherwise healthy. They were counseled that there are many different causes of learning disabilities including environmental, multifactorial, and genetic etiologies.  In the case that the learning disability is due to premature delivery, recurrence risk would not likely be increased for relatives. However, in the case of an underlying genetic of multifactorial cause, recurrence risk could be increased for relatives. We discussed that without more specific  information, it is difficult to provide an accurate risk assessment.  Further genetic counseling is warranted if more information is obtained.  Laura Barry was provided with written information regarding cystic fibrosis (CF) including the carrier frequency and incidence in the Caucasian population, the availability of carrier testing and prenatal diagnosis if indicated.  In addition, we discussed that CF is routinely screened for as part of the [facility] newborn screening panel.  Given the nature of today's discussion, CF carrier screening was not discussed at the time of today's visit. This screening is available to the patient if desired and if not previously performed.    Laura Barry denied exposure to environmental toxins or chemical agents. She denied the use of tobacco or street drugs. She reported drinking two alcoholic beverages the first weekend of December, prior to being aware of the pregnancy and no alcohol use since that time. The "all-or-none" period was discussed, meaning exposures that occur in the first 4 weeks of gestation are typically thought to either not affect the pregnancy at all or result in a miscarriage. She denied significant viral illnesses during the course of her pregnancy. Her medical and surgical histories were noncontributory.   I counseled this couple regarding the above risks and available options.  The approximate face-to-face time with the genetic counselor was 40 minutes.  Chipper Oman, MS Certified Genetic Counselor 08/26/2014

## 2014-08-27 ENCOUNTER — Encounter (HOSPITAL_COMMUNITY): Payer: Self-pay | Admitting: Obstetrics & Gynecology

## 2014-08-27 ENCOUNTER — Other Ambulatory Visit (HOSPITAL_COMMUNITY): Payer: Self-pay | Admitting: Obstetrics & Gynecology

## 2014-08-29 ENCOUNTER — Telehealth (HOSPITAL_COMMUNITY): Payer: Self-pay | Admitting: MS"

## 2014-08-29 NOTE — Telephone Encounter (Signed)
Left message for patient regarding [facility] at [facility]:30 am. Asked patient to return call to confirm this appointment and discuss additional information regarding directions to the location.   Clydie BraunKaren Jaekwon Mcclune  08/29/2014  3:32 PM

## 2014-09-05 ENCOUNTER — Telehealth (HOSPITAL_COMMUNITY): Payer: Self-pay | Admitting: MS"

## 2014-09-05 NOTE — Telephone Encounter (Signed)
Left message for Ms. Laura FramesKati B Barry that tour of [facility] has been scheduled for 1:00 pm on 3/23 to follow her meeting with the pediatric surgeon that day at 11:30. Instructed the patient to go to [employer] at the medical center. The valet will park the car for free and will call for Fulton MoleLee Daniels, who is the social worker that will give them the tour, if he is not already there waiting for her. Left message that patient can call back with additional questions.    [name] 09/05/2014 10:12 AM

## 2014-09-12 ENCOUNTER — Telehealth (HOSPITAL_COMMUNITY): Payer: Self-pay | Admitting: MS"

## 2014-09-12 NOTE — Telephone Encounter (Signed)
Mrs. Andria FramesKati B Wauters called to confirm that she received message about appointment [facility] and [facility] that date. She also asked if we could facilitate prenatal consult with [facility]. Explained that I was not familiar with the [facility] but could provide her the number for the appointments. The patient requested for me to facilitate appointment.   Spoke with Joni Reiningicole at [facility] [phone]) and she stated that they do prenatal consultations for omphalocele. She requested for demographic and ultrasound information to be faxed to them, and they will contact patient directly regarding appointment.   Clydie BraunKaren Brentton Wardlow 09/12/2014 3:50 PM

## 2014-09-17 ENCOUNTER — Ambulatory Visit (HOSPITAL_COMMUNITY): Payer: BC Managed Care – PPO

## 2014-09-17 ENCOUNTER — Encounter (HOSPITAL_COMMUNITY): Payer: BC Managed Care – PPO

## 2014-09-20 ENCOUNTER — Other Ambulatory Visit (HOSPITAL_COMMUNITY): Payer: Self-pay | Admitting: Certified Nurse Midwife

## 2014-09-20 ENCOUNTER — Ambulatory Visit (HOSPITAL_COMMUNITY)
Admission: RE | Admit: 2014-09-20 | Discharge: 2014-09-20 | Disposition: A | Discharge status: Home / Self Care | Payer: BC Managed Care – PPO | Source: Ambulatory Visit | Attending: Certified Nurse Midwife | Admitting: Certified Nurse Midwife

## 2014-09-20 ENCOUNTER — Ambulatory Visit (HOSPITAL_COMMUNITY): Payer: BC Managed Care – PPO

## 2014-09-20 ENCOUNTER — Encounter (HOSPITAL_COMMUNITY): Payer: Self-pay

## 2014-09-20 ENCOUNTER — Encounter (HOSPITAL_COMMUNITY): Payer: BC Managed Care – PPO

## 2014-09-20 DIAGNOSIS — IMO0002 Reserved for concepts with insufficient information to code with codable children: Secondary | ICD-10-CM | POA: Insufficient documentation

## 2014-09-20 DIAGNOSIS — Z36 Encounter for antenatal screening of mother: Secondary | ICD-10-CM | POA: Insufficient documentation

## 2014-09-20 DIAGNOSIS — Z0489 Encounter for examination and observation for other specified reasons: Secondary | ICD-10-CM | POA: Insufficient documentation

## 2014-09-20 DIAGNOSIS — O358XX9 Maternal care for other (suspected) fetal abnormality and damage, other fetus: Principal | ICD-10-CM

## 2014-09-20 DIAGNOSIS — IMO0001 Reserved for inherently not codable concepts without codable children: Secondary | ICD-10-CM

## 2014-09-20 DIAGNOSIS — Z3A18 18 weeks gestation of pregnancy: Secondary | ICD-10-CM | POA: Diagnosis not present

## 2014-09-20 DIAGNOSIS — O358XX0 Maternal care for other (suspected) fetal abnormality and damage, not applicable or unspecified: Secondary | ICD-10-CM | POA: Diagnosis present

## 2014-09-26 ENCOUNTER — Telehealth (HOSPITAL_COMMUNITY): Payer: Self-pay | Admitting: MS"

## 2014-09-26 NOTE — Telephone Encounter (Signed)
Returned patient's call. She was calling to inquire about referral to [facility] for prenatal consultation. Let Ms. Stancil know that I called [facility] peds for referral and faxed over the information to them. The individual from their office said that they would contact her directly with information regarding the appointment. Encouraged patient to contact me back if she does not hear from them though.   Patient stated that they are doing well. They found out that the baby is a boy and they plan to name him "Marylouise StacksCarter."  Ronica Vivian 09/26/2014 9:15 AM

## 2014-09-27 ENCOUNTER — Telehealth (HOSPITAL_COMMUNITY): Payer: Self-pay | Admitting: MS"

## 2014-09-27 NOTE — Telephone Encounter (Signed)
Mrs. Laura Barry called to let me know that she is scheduled to [facility]/19 at 1:10. She left a message with the head nurse at [facility] to hopefully schedule a tour also. She asked if she needs to let us know which hospital they decide to deliver at. Let her know that she can let us but will especially need to discuss with her primary OB.   Clydie BraunKaren Anaria Kroner 09/27/2014 4:06 PM

## 2014-10-15 ENCOUNTER — Telehealth (HOSPITAL_COMMUNITY): Payer: Self-pay | Admitting: MS"

## 2014-10-15 NOTE — Telephone Encounter (Signed)
Patient called to state that they have decided they would like to [facility] and for [facility] to [facility]. Explained to patient that she should keep her regularly scheduled OB visits at this time and that at her next ultrasound appointment with us, the physician can discuss the appropriate time to transfer her OB care [facility].   Laura BraunKaren Deborrah Barry 10/15/2014 3:38 PM

## 2014-10-18 ENCOUNTER — Encounter (HOSPITAL_COMMUNITY): Payer: Self-pay

## 2014-10-18 ENCOUNTER — Ambulatory Visit (HOSPITAL_COMMUNITY)
Admission: RE | Admit: 2014-10-18 | Discharge: 2014-10-18 | Disposition: A | Discharge status: Home / Self Care | Payer: BC Managed Care – PPO | Source: Ambulatory Visit | Attending: Certified Nurse Midwife | Admitting: Certified Nurse Midwife

## 2014-10-18 DIAGNOSIS — IMO0001 Reserved for inherently not codable concepts without codable children: Secondary | ICD-10-CM

## 2014-10-18 DIAGNOSIS — Z3A22 22 weeks gestation of pregnancy: Secondary | ICD-10-CM | POA: Insufficient documentation

## 2014-10-18 DIAGNOSIS — O358XX0 Maternal care for other (suspected) fetal abnormality and damage, not applicable or unspecified: Secondary | ICD-10-CM | POA: Diagnosis not present

## 2014-10-18 DIAGNOSIS — O358XX9 Maternal care for other (suspected) fetal abnormality and damage, other fetus: Secondary | ICD-10-CM

## 2014-10-25 ENCOUNTER — Other Ambulatory Visit (HOSPITAL_COMMUNITY): Payer: Self-pay | Admitting: Obstetrics and Gynecology

## 2014-10-25 DIAGNOSIS — O359XX0 Maternal care for (suspected) fetal abnormality and damage, unspecified, not applicable or unspecified: Secondary | ICD-10-CM

## 2014-11-13 ENCOUNTER — Encounter (HOSPITAL_COMMUNITY): Payer: Self-pay

## 2014-11-13 ENCOUNTER — Ambulatory Visit (HOSPITAL_COMMUNITY)
Admission: RE | Admit: 2014-11-13 | Discharge: 2014-11-13 | Disposition: A | Discharge status: Home / Self Care | Payer: BC Managed Care – PPO | Source: Ambulatory Visit | Attending: Certified Nurse Midwife | Admitting: Certified Nurse Midwife

## 2014-11-13 DIAGNOSIS — O359XX0 Maternal care for (suspected) fetal abnormality and damage, unspecified, not applicable or unspecified: Secondary | ICD-10-CM

## 2014-11-13 DIAGNOSIS — O358XX0 Maternal care for other (suspected) fetal abnormality and damage, not applicable or unspecified: Secondary | ICD-10-CM | POA: Insufficient documentation

## 2014-11-13 DIAGNOSIS — Z3A26 26 weeks gestation of pregnancy: Secondary | ICD-10-CM | POA: Insufficient documentation

## 2014-11-14 ENCOUNTER — Other Ambulatory Visit (HOSPITAL_COMMUNITY): Payer: Self-pay | Admitting: Certified Nurse Midwife

## 2014-11-14 ENCOUNTER — Encounter (HOSPITAL_COMMUNITY): Payer: Self-pay | Admitting: Certified Nurse Midwife

## 2014-12-09 ENCOUNTER — Other Ambulatory Visit (HOSPITAL_COMMUNITY): Payer: Self-pay | Admitting: Certified Nurse Midwife

## 2014-12-09 DIAGNOSIS — O359XX1 Maternal care for (suspected) fetal abnormality and damage, unspecified, fetus 1: Secondary | ICD-10-CM

## 2014-12-09 DIAGNOSIS — Z3A29 29 weeks gestation of pregnancy: Secondary | ICD-10-CM

## 2014-12-11 ENCOUNTER — Ambulatory Visit (HOSPITAL_COMMUNITY)
Admission: RE | Admit: 2014-12-11 | Discharge: 2014-12-11 | Disposition: A | Discharge status: Home / Self Care | Payer: BC Managed Care – PPO | Source: Ambulatory Visit | Attending: Certified Nurse Midwife | Admitting: Certified Nurse Midwife

## 2014-12-11 DIAGNOSIS — Z3A30 30 weeks gestation of pregnancy: Secondary | ICD-10-CM | POA: Insufficient documentation

## 2014-12-11 DIAGNOSIS — O358XX0 Maternal care for other (suspected) fetal abnormality and damage, not applicable or unspecified: Secondary | ICD-10-CM | POA: Diagnosis not present

## 2014-12-11 DIAGNOSIS — O359XX0 Maternal care for (suspected) fetal abnormality and damage, unspecified, not applicable or unspecified: Secondary | ICD-10-CM | POA: Insufficient documentation

## 2014-12-11 DIAGNOSIS — O359XX1 Maternal care for (suspected) fetal abnormality and damage, unspecified, fetus 1: Secondary | ICD-10-CM

## 2014-12-11 DIAGNOSIS — Z3A29 29 weeks gestation of pregnancy: Secondary | ICD-10-CM | POA: Insufficient documentation

## 2015-01-02 DIAGNOSIS — IMO0002 Reserved for concepts with insufficient information to code with codable children: Secondary | ICD-10-CM | POA: Insufficient documentation

## 2015-01-08 ENCOUNTER — Other Ambulatory Visit (HOSPITAL_COMMUNITY): Payer: Self-pay | Admitting: Certified Nurse Midwife

## 2015-01-08 DIAGNOSIS — O359XX1 Maternal care for (suspected) fetal abnormality and damage, unspecified, fetus 1: Secondary | ICD-10-CM

## 2015-01-08 DIAGNOSIS — Z3A34 34 weeks gestation of pregnancy: Secondary | ICD-10-CM

## 2015-01-09 ENCOUNTER — Ambulatory Visit (HOSPITAL_COMMUNITY)
Admission: RE | Admit: 2015-01-09 | Discharge: 2015-01-09 | Disposition: A | Discharge status: Home / Self Care | Payer: BC Managed Care – PPO | Source: Ambulatory Visit | Attending: Obstetrics and Gynecology | Admitting: Obstetrics and Gynecology

## 2015-01-09 ENCOUNTER — Encounter (HOSPITAL_COMMUNITY): Payer: Self-pay

## 2015-01-09 DIAGNOSIS — O359XX0 Maternal care for (suspected) fetal abnormality and damage, unspecified, not applicable or unspecified: Secondary | ICD-10-CM | POA: Insufficient documentation

## 2015-01-09 DIAGNOSIS — O358XX0 Maternal care for other (suspected) fetal abnormality and damage, not applicable or unspecified: Secondary | ICD-10-CM | POA: Insufficient documentation

## 2015-01-09 DIAGNOSIS — O359XX1 Maternal care for (suspected) fetal abnormality and damage, unspecified, fetus 1: Secondary | ICD-10-CM

## 2015-01-09 DIAGNOSIS — Z3A34 34 weeks gestation of pregnancy: Secondary | ICD-10-CM | POA: Diagnosis not present

## 2015-02-06 DIAGNOSIS — Z331 Pregnant state, incidental: Secondary | ICD-10-CM | POA: Insufficient documentation

## 2015-03-18 DIAGNOSIS — Z98891 History of uterine scar from previous surgery: Secondary | ICD-10-CM | POA: Insufficient documentation

## 2015-04-24 ENCOUNTER — Encounter: Payer: Self-pay | Admitting: Physician Assistant

## 2015-04-24 ENCOUNTER — Ambulatory Visit (INDEPENDENT_AMBULATORY_CARE_PROVIDER_SITE_OTHER): Payer: BC Managed Care – PPO | Admitting: Physician Assistant

## 2015-04-24 VITALS — BP 122/66 | HR 61 | Temp 97.9°F | Resp 16 | Ht 62.5 in | Wt 145.0 lb

## 2015-04-24 DIAGNOSIS — M545 Low back pain, unspecified: Secondary | ICD-10-CM

## 2015-04-24 MED ORDER — CARISOPRODOL 250 MG PO TABS: 350.0000 mg | ORAL_TABLET | Freq: Every day | ORAL | Status: DC

## 2015-04-24 MED ORDER — MELOXICAM 15 MG PO TABS: ORAL_TABLET | ORAL | Status: DC

## 2015-04-24 NOTE — Patient Instructions (Signed)
Low Back Sprain With Rehab A sprain is an injury in which a ligament is torn. The ligaments of the lower back are vulnerable to sprains. However, they are strong and require great force to be injured. These ligaments are important for stabilizing the spinal column. Sprains are classified into three categories. Grade 1 sprains cause pain, but the tendon is not lengthened. Grade 2 sprains include a lengthened ligament, due to the ligament being stretched or partially ruptured. With grade 2 sprains there is still function, although the function may be decreased. Grade 3 sprains involve a complete tear of the tendon or muscle, and function is usually impaired. SYMPTOMS   Severe pain in the lower back.  Sometimes, a feeling of a "pop," "snap," or tear, at the time of injury.  Tenderness and sometimes swelling at the injury site.  Uncommonly, bruising (contusion) within 48 hours of injury.  Muscle spasms in the back. CAUSES  Low back sprains occur when a force is placed on the ligaments that is greater than they can handle. Common causes of injury include:  Performing a stressful act while off-balance.  Repetitive stressful activities that involve movement of the lower back.  Direct hit (trauma) to the lower back. RISK INCREASES WITH:  Contact sports (football, wrestling).  Collisions (major skiing accidents).  Sports that require throwing or lifting (baseball, weightlifting).  Sports involving twisting of the spine (gymnastics, diving, tennis, golf).  Poor strength and flexibility.  Inadequate protection.  Previous back injury or surgery (especially fusion). PREVENTION  Wear properly fitted and padded protective equipment.  Warm up and stretch properly before activity.  Allow for adequate recovery between workouts.  Maintain physical fitness:  Strength, flexibility, and endurance.  Cardiovascular fitness.  Maintain a healthy body weight. PROGNOSIS  If treated properly,  low back sprains usually heal with non-surgical treatment. The length of time for healing depends on the severity of the injury.  RELATED COMPLICATIONS   Recurring symptoms, resulting in a chronic problem.  Chronic inflammation and pain in the low back.  Delayed healing or resolution of symptoms, especially if activity is resumed too soon.  Prolonged impairment.  Unstable or arthritic joints of the low back. TREATMENT  Treatment first involves the use of ice and medicine, to reduce pain and inflammation. The use of strengthening and stretching exercises may help reduce pain with activity. These exercises may be performed at home or with a therapist. Severe injuries may require referral to a therapist for further evaluation and treatment, such as ultrasound. Your caregiver may advise that you wear a back brace or corset, to help reduce pain and discomfort. Often, prolonged bed rest results in greater harm then benefit. Corticosteroid injections may be recommended. However, these should be reserved for the most serious cases. It is important to avoid using your back when lifting objects. At night, sleep on your back on a firm mattress, with a pillow placed under your knees. If non-surgical treatment is unsuccessful, surgery may be needed.  MEDICATION   If pain medicine is needed, nonsteroidal anti-inflammatory medicines (aspirin and ibuprofen), or other minor pain relievers (acetaminophen), are often advised.  Do not take pain medicine for 7 days before surgery.  Prescription pain relievers may be given, if your caregiver thinks they are needed. Use only as directed and only as much as you need.  Ointments applied to the skin may be helpful.  Corticosteroid injections may be given by your caregiver. These injections should be reserved for the most serious cases, because   they may only be given a certain number of times. HEAT AND COLD  Cold treatment (icing) should be applied for 10 to 15  minutes every 2 to 3 hours for inflammation and pain, and immediately after activity that aggravates your symptoms. Use ice packs or an ice massage.  Heat treatment may be used before performing stretching and strengthening activities prescribed by your caregiver, physical therapist, or athletic trainer. Use a heat pack or a warm water soak. SEEK MEDICAL CARE IF:   Symptoms get worse or do not improve in 2 to 4 weeks, despite treatment.  You develop numbness or weakness in either leg.  You lose bowel or bladder function.  Any of the following occur after surgery: fever, increased pain, swelling, redness, drainage of fluids, or bleeding in the affected area.  New, unexplained symptoms develop. (Drugs used in treatment may produce side effects.) EXERCISES  RANGE OF MOTION (ROM) AND STRETCHING EXERCISES - Low Back Sprain Most people with lower back pain will find that their symptoms get worse with excessive bending forward (flexion) or arching at the lower back (extension). The exercises that will help resolve your symptoms will focus on the opposite motion.  Your physician, physical therapist or athletic trainer will help you determine which exercises will be most helpful to resolve your lower back pain. Do not complete any exercises without first consulting with your caregiver. Discontinue any exercises which make your symptoms worse, until you speak to your caregiver. If you have pain, numbness or tingling which travels down into your buttocks, leg or foot, the goal of the therapy is for these symptoms to move closer to your back and eventually resolve. Sometimes, these leg symptoms will get better, but your lower back pain may worsen. This is often an indication of progress in your rehabilitation. Be very alert to any changes in your symptoms and the activities in which you participated in the 24 hours prior to the change. Sharing this information with your caregiver will allow him or her to most  efficiently treat your condition. These exercises may help you when beginning to rehabilitate your injury. Your symptoms may resolve with or without further involvement from your physician, physical therapist or athletic trainer. While completing these exercises, remember:   Restoring tissue flexibility helps normal motion to return to the joints. This allows healthier, less painful movement and activity.  An effective stretch should be held for at least 30 seconds.  A stretch should never be painful. You should only feel a gentle lengthening or release in the stretched tissue. FLEXION RANGE OF MOTION AND STRETCHING EXERCISES: STRETCH - Flexion, Single Knee to Chest   Lie on a firm bed or floor with both legs extended in front of you.  Keeping one leg in contact with the floor, bring your opposite knee to your chest. Hold your leg in place by either grabbing behind your thigh or at your knee.  Pull until you feel a gentle stretch in your low back. Hold __________ seconds.  Slowly release your grasp and repeat the exercise with the opposite side. Repeat __________ times. Complete this exercise __________ times per day.  STRETCH - Flexion, Double Knee to Chest  Lie on a firm bed or floor with both legs extended in front of you.  Keeping one leg in contact with the floor, bring your opposite knee to your chest.  Tense your stomach muscles to support your back and then lift your other knee to your chest. Hold your legs in   place by either grabbing behind your thighs or at your knees.  Pull both knees toward your chest until you feel a gentle stretch in your low back. Hold __________ seconds.  Tense your stomach muscles and slowly return one leg at a time to the floor. Repeat __________ times. Complete this exercise __________ times per day.  STRETCH - Low Trunk Rotation  Lie on a firm bed or floor. Keeping your legs in front of you, bend your knees so they are both pointed toward the  ceiling and your feet are flat on the floor.  Extend your arms out to the side. This will stabilize your upper body by keeping your shoulders in contact with the floor.  Gently and slowly drop both knees together to one side until you feel a gentle stretch in your low back. Hold for __________ seconds.  Tense your stomach muscles to support your lower back as you bring your knees back to the starting position. Repeat the exercise to the other side. Repeat __________ times. Complete this exercise __________ times per day  EXTENSION RANGE OF MOTION AND FLEXIBILITY EXERCISES: STRETCH - Extension, Prone on Elbows   Lie on your stomach on the floor, a bed will be too soft. Place your palms about shoulder width apart and at the height of your head.  Place your elbows under your shoulders. If this is too painful, stack pillows under your chest.  Allow your body to relax so that your hips drop lower and make contact more completely with the floor.  Hold this position for __________ seconds.  Slowly return to lying flat on the floor. Repeat __________ times. Complete this exercise __________ times per day.  RANGE OF MOTION - Extension, Prone Press Ups  Lie on your stomach on the floor, a bed will be too soft. Place your palms about shoulder width apart and at the height of your head.  Keeping your back as relaxed as possible, slowly straighten your elbows while keeping your hips on the floor. You may adjust the placement of your hands to maximize your comfort. As you gain motion, your hands will come more underneath your shoulders.  Hold this position __________ seconds.  Slowly return to lying flat on the floor. Repeat __________ times. Complete this exercise __________ times per day.  RANGE OF MOTION- Quadruped, Neutral Spine   Assume a hands and knees position on a firm surface. Keep your hands under your shoulders and your knees under your hips. You may place padding under your knees for  comfort.  Drop your head and point your tailbone toward the ground below you. This will round out your lower back like an angry cat. Hold this position for __________ seconds.  Slowly lift your head and release your tail bone so that your back sags into a large arch, like an old horse.  Hold this position for __________ seconds.  Repeat this until you feel limber in your low back.  Now, find your "sweet spot." This will be the most comfortable position somewhere between the two previous positions. This is your neutral spine. Once you have found this position, tense your stomach muscles to support your low back.  Hold this position for __________ seconds. Repeat __________ times. Complete this exercise __________ times per day.  STRENGTHENING EXERCISES - Low Back Sprain These exercises may help you when beginning to rehabilitate your injury. These exercises should be done near your "sweet spot." This is the neutral, low-back arch, somewhere between fully rounded and   fully arched, that is your least painful position. When performed in this safe range of motion, these exercises can be used for people who have either a flexion or extension based injury. These exercises may resolve your symptoms with or without further involvement from your physician, physical therapist or athletic trainer. While completing these exercises, remember:   Muscles can gain both the endurance and the strength needed for everyday activities through controlled exercises.  Complete these exercises as instructed by your physician, physical therapist or athletic trainer. Increase the resistance and repetitions only as guided.  You may experience muscle soreness or fatigue, but the pain or discomfort you are trying to eliminate should never worsen during these exercises. If this pain does worsen, stop and make certain you are following the directions exactly. If the pain is still present after adjustments, discontinue the  exercise until you can discuss the trouble with your caregiver. STRENGTHENING - Deep Abdominals, Pelvic Tilt   Lie on a firm bed or floor. Keeping your legs in front of you, bend your knees so they are both pointed toward the ceiling and your feet are flat on the floor.  Tense your lower abdominal muscles to press your low back into the floor. This motion will rotate your pelvis so that your tail bone is scooping upwards rather than pointing at your feet or into the floor. With a gentle tension and even breathing, hold this position for __________ seconds. Repeat __________ times. Complete this exercise __________ times per day.  STRENGTHENING - Abdominals, Crunches   Lie on a firm bed or floor. Keeping your legs in front of you, bend your knees so they are both pointed toward the ceiling and your feet are flat on the floor. Cross your arms over your chest.  Slightly tip your chin down without bending your neck.  Tense your abdominals and slowly lift your trunk high enough to just clear your shoulder blades. Lifting higher can put excessive stress on the lower back and does not further strengthen your abdominal muscles.  Control your return to the starting position. Repeat __________ times. Complete this exercise __________ times per day.  STRENGTHENING - Quadruped, Opposite UE/LE Lift   Assume a hands and knees position on a firm surface. Keep your hands under your shoulders and your knees under your hips. You may place padding under your knees for comfort.  Find your neutral spine and gently tense your abdominal muscles so that you can maintain this position. Your shoulders and hips should form a rectangle that is parallel with the floor and is not twisted.  Keeping your trunk steady, lift your right hand no higher than your shoulder and then your left leg no higher than your hip. Make sure you are not holding your breath. Hold this position for __________ seconds.  Continuing to keep  your abdominal muscles tense and your back steady, slowly return to your starting position. Repeat with the opposite arm and leg. Repeat __________ times. Complete this exercise __________ times per day.  STRENGTHENING - Abdominals and Quadriceps, Straight Leg Raise   Lie on a firm bed or floor with both legs extended in front of you.  Keeping one leg in contact with the floor, bend the other knee so that your foot can rest flat on the floor.  Find your neutral spine, and tense your abdominal muscles to maintain your spinal position throughout the exercise.  Slowly lift your straight leg off the floor about 6 inches for a count of   15, making sure to not hold your breath.  Still keeping your neutral spine, slowly lower your leg all the way to the floor. Repeat this exercise with each leg __________ times. Complete this exercise __________ times per day. POSTURE AND BODY MECHANICS CONSIDERATIONS - Low Back Sprain Keeping correct posture when sitting, standing or completing your activities will reduce the stress put on different body tissues, allowing injured tissues a chance to heal and limiting painful experiences. The following are general guidelines for improved posture. Your physician or physical therapist will provide you with any instructions specific to your needs. While reading these guidelines, remember:  The exercises prescribed by your provider will help you have the flexibility and strength to maintain correct postures.  The correct posture provides the best environment for your joints to work. All of your joints have less wear and tear when properly supported by a spine with good posture. This means you will experience a healthier, less painful body.  Correct posture must be practiced with all of your activities, especially prolonged sitting and standing. Correct posture is as important when doing repetitive low-stress activities (typing) as it is when doing a single heavy-load  activity (lifting). RESTING POSITIONS Consider which positions are most painful for you when choosing a resting position. If you have pain with flexion-based activities (sitting, bending, stooping, squatting), choose a position that allows you to rest in a less flexed posture. You would want to avoid curling into a fetal position on your side. If your pain worsens with extension-based activities (prolonged standing, working overhead), avoid resting in an extended position such as sleeping on your stomach. Most people will find more comfort when they rest with their spine in a more neutral position, neither too rounded nor too arched. Lying on a non-sagging bed on your side with a pillow between your knees, or on your back with a pillow under your knees will often provide some relief. Keep in mind, being in any one position for a prolonged period of time, no matter how correct your posture, can still lead to stiffness. PROPER SITTING POSTURE In order to minimize stress and discomfort on your spine, you must sit with correct posture. Sitting with good posture should be effortless for a healthy body. Returning to good posture is a gradual process. Many people can work toward this most comfortably by using various supports until they have the flexibility and strength to maintain this posture on their own. When sitting with proper posture, your ears will fall over your shoulders and your shoulders will fall over your hips. You should use the back of the chair to support your upper back. Your lower back will be in a neutral position, just slightly arched. You may place a small pillow or folded towel at the base of your lower back for  support.  When working at a desk, create an environment that supports good, upright posture. Without extra support, muscles tire, which leads to excessive strain on joints and other tissues. Keep these recommendations in mind: CHAIR:  A chair should be able to slide under your desk  when your back makes contact with the back of the chair. This allows you to work closely.  The chair's height should allow your eyes to be level with the upper part of your monitor and your hands to be slightly lower than your elbows. BODY POSITION  Your feet should make contact with the floor. If this is not possible, use a foot rest.  Keep your ears   over your shoulders. This will reduce stress on your neck and low back. INCORRECT SITTING POSTURES  If you are feeling tired and unable to assume a healthy sitting posture, do not slouch or slump. This puts excessive strain on your back tissues, causing more damage and pain. Healthier options include:  Using more support, like a lumbar pillow.  Switching tasks to something that requires you to be upright or walking.  Talking a brief walk.  Lying down to rest in a neutral-spine position. PROLONGED STANDING WHILE SLIGHTLY LEANING FORWARD  When completing a task that requires you to lean forward while standing in one place for a long time, place either foot up on a stationary 2-4 inch high object to help maintain the best posture. When both feet are on the ground, the lower back tends to lose its slight inward curve. If this curve flattens (or becomes too large), then the back and your other joints will experience too much stress, tire more quickly, and can cause pain. CORRECT STANDING POSTURES Proper standing posture should be assumed with all daily activities, even if they only take a few moments, like when brushing your teeth. As in sitting, your ears should fall over your shoulders and your shoulders should fall over your hips. You should keep a slight tension in your abdominal muscles to brace your spine. Your tailbone should point down to the ground, not behind your body, resulting in an over-extended swayback posture.  INCORRECT STANDING POSTURES  Common incorrect standing postures include a forward head, locked knees and/or an excessive  swayback. WALKING Walk with an upright posture. Your ears, shoulders and hips should all line-up. PROLONGED ACTIVITY IN A FLEXED POSITION When completing a task that requires you to bend forward at your waist or lean over a low surface, try to find a way to stabilize 3 out of 4 of your limbs. You can place a hand or elbow on your thigh or rest a knee on the surface you are reaching across. This will provide you more stability, so that your muscles do not tire as quickly. By keeping your knees relaxed, or slightly bent, you will also reduce stress across your lower back. CORRECT LIFTING TECHNIQUES DO :  Assume a wide stance. This will provide you more stability and the opportunity to get as close as possible to the object which you are lifting.  Tense your abdominals to brace your spine. Bend at the knees and hips. Keeping your back locked in a neutral-spine position, lift using your leg muscles. Lift with your legs, keeping your back straight.  Test the weight of unknown objects before attempting to lift them.  Try to keep your elbows locked down at your sides in order get the best strength from your shoulders when carrying an object.  Always ask for help when lifting heavy or awkward objects. INCORRECT LIFTING TECHNIQUES DO NOT:   Lock your knees when lifting, even if it is a small object.  Bend and twist. Pivot at your feet or move your feet when needing to change directions.  Assume that you can safely pick up even a paperclip without proper posture.   This information is not intended to replace advice given to you by your health care provider. Make sure you discuss any questions you have with your health care provider.   Document Released: 06/14/2005 Document Revised: 07/05/2014 Document Reviewed: 09/26/2008 [employer] Patient Education 2016 [employer]  

## 2015-04-24 NOTE — Progress Notes (Signed)
   Subjective:    Patient ID: Laura Barry, female    DOB: [birth year], 29 y.o.   MRN: [id number]  HPI 29 y.o. WF had son in August with Csection, has been having back pain since that time. She states it has been intermittently worse, worse last week. She has trouble getting up from the floor, worse with sitting and lying down. Has tried lower back band from Csection, ibuprofen has helped some, has taken 4. Son, Collene MaresKarter, was in the nicu x 2 weeks, and has been traveling to see him for past 2 month. Not breast feeding. Patient denies fever, hematuria, incontinence, numbness, tingling, weakness and saddle anesthesia  Blood pressure 122/66, pulse 61, temperature 97.9 F (36.6 C), temperature source Temporal, resp. rate 16, height 5' 2.5" (1.588 m), weight 145 lb (65.772 kg), last menstrual period 05/13/2014, SpO2 96 %, unknown if currently breastfeeding.  Current Outpatient Prescriptions on File Prior to Visit  Medication Sig Dispense Refill  . Prenatal Vit-Fe Fumarate-FA (PRENATAL MULTIVITAMIN) TABS tablet Take 1 tablet by mouth daily at 12 noon.     No current facility-administered medications on file prior to visit.   Past Medical History  Diagnosis Date  . Allergic rhinitis   . Allergy   . GERD (gastroesophageal reflux disease)     Review of Systems  Constitutional: Negative.  Negative for fever and chills.  HENT: Negative.   Respiratory: Negative.   Cardiovascular: Negative.   Gastrointestinal: Negative.   Genitourinary: Negative for dysuria, urgency, frequency, flank pain, decreased urine volume, vaginal bleeding, vaginal discharge, enuresis, genital sores, vaginal pain, menstrual problem and pelvic pain.  Musculoskeletal: Positive for back pain and gait problem. Negative for myalgias, joint swelling, arthralgias, neck pain and neck stiffness.  Skin: Negative.  Negative for rash.  Neurological: Negative.  Negative for dizziness.  Psychiatric/Behavioral: Negative.         Objective:   Physical Exam  Constitutional: She is oriented to person, place, and time. She appears well-developed and well-nourished.  HENT:  Head: Normocephalic and atraumatic.  Eyes: Conjunctivae are normal. Pupils are equal, round, and reactive to light.  Neck: Normal range of motion. Neck supple.  Cardiovascular: Normal rate and regular rhythm.   Pulmonary/Chest: Effort normal and breath sounds normal.  Abdominal: Soft. Bowel sounds are normal. There is no tenderness.  Musculoskeletal:  Patient is able to ambulate well. Gait is not  Antalgic. Straight leg raising with dorsiflexion negative bilaterally for radicular symptoms. Sensory exam in the legs are normal. Knee reflexes are normal Ankle reflexes are normal Strength is normal and symmetric in arms and legs. There is not SI tenderness to palpation.  There isparaspinal muscle spasm.  There is not midline tenderness.  ROM of spine with  limited in all spheres due to pain.   Lymphadenopathy:    She has no cervical adenopathy.  Neurological: She is alert and oriented to person, place, and time. She has normal reflexes.  Skin: Skin is warm and dry. No rash noted.      Assessment & Plan:  Lower back pain- negative straight leg NSAIDs, RICE, and exercise given, soma- will not breast feed If not better follow up in office or will refer to PT/orthopedics. Agricultural engineerducational material distributed. Proper lifting, bending technique discussed. Stretching exercises discussed. Heat to affected area as needed for local pain relief.

## 2015-09-16 ENCOUNTER — Other Ambulatory Visit: Payer: BLUE CROSS/BLUE SHIELD

## 2015-09-16 ENCOUNTER — Ambulatory Visit: Payer: Self-pay | Admitting: Internal Medicine

## 2015-09-16 ENCOUNTER — Other Ambulatory Visit: Payer: Self-pay | Admitting: Physician Assistant

## 2015-09-16 DIAGNOSIS — N390 Urinary tract infection, site not specified: Secondary | ICD-10-CM | POA: Diagnosis not present

## 2015-09-16 MED ORDER — SULFAMETHOXAZOLE-TRIMETHOPRIM 800-160 MG PO TABS: 1.0000 | ORAL_TABLET | Freq: Two times a day (BID) | ORAL | Status: DC

## 2015-09-17 LAB — URINALYSIS, ROUTINE W REFLEX MICROSCOPIC
BILIRUBIN URINE: NEGATIVE
Glucose, UA: NEGATIVE
KETONES UR: NEGATIVE
NITRITE: POSITIVE — AB
SPECIFIC GRAVITY, URINE: 1.007 (ref 1.001–1.035)
pH: 6.5 (ref 5.0–8.0)

## 2015-09-17 LAB — URINALYSIS, MICROSCOPIC ONLY
CASTS: NONE SEEN [LPF]
Crystals: NONE SEEN [HPF]
WBC, UA: >60 WBC/HPF — AB (ref <=5)
Yeast: NONE SEEN [HPF]

## 2015-09-18 LAB — URINE CULTURE: Colony Count: >=100000

## 2015-09-18 MED ORDER — CIPROFLOXACIN HCL 500 MG PO TABS: 500.0000 mg | ORAL_TABLET | Freq: Two times a day (BID) | ORAL | Status: DC

## 2015-09-18 NOTE — Addendum Note (Signed)
Addended by: Quentin MullingOLLIER, Denissa Cozart R on: 09/18/2015 05:58 PM   Modules accepted: Orders

## 2015-12-05 ENCOUNTER — Ambulatory Visit (INDEPENDENT_AMBULATORY_CARE_PROVIDER_SITE_OTHER): Payer: BLUE CROSS/BLUE SHIELD | Admitting: Physician Assistant

## 2015-12-05 ENCOUNTER — Encounter: Payer: Self-pay | Admitting: Physician Assistant

## 2015-12-05 VITALS — BP 122/74 | HR 107 | Temp 97.2°F | Resp 16 | Ht 62.5 in | Wt 132.8 lb

## 2015-12-05 DIAGNOSIS — R350 Frequency of micturition: Secondary | ICD-10-CM

## 2015-12-05 MED ORDER — CIPROFLOXACIN HCL 500 MG PO TABS: 500.0000 mg | ORAL_TABLET | Freq: Two times a day (BID) | ORAL | Status: DC

## 2015-12-05 NOTE — Patient Instructions (Signed)

## 2015-12-05 NOTE — Progress Notes (Signed)
HPI: complains of UTI symptoms Started at 330 this AM associated with discomfort with urination and small volume voiding with increased frequency denies dysuria, hematuria, flank pain or fever The patient has a history of prior UTI  PMH: reviewed  ROS:  Gen.: No unexpected weight change, no night sweats Lungs: No cough or shortness of breath Cardiovascular: No palpitations or chest pain  PE: BP 122/74 mmHg  Pulse 107  Temp(Src) 97.2 F (36.2 C) (Temporal)  Resp 16  Ht 5' 2.5" (1.588 m)  Wt 132 lb 12.8 oz (60.238 kg)  BMI 23.89 kg/m2  SpO2 96%  LMP 11/26/2015 General: No acute distress Lungs: Clear to auscultation Cardiovascular: Regular rate rhythm, no edema Abdomen: Mild to moderate discomfort of her suprapubic region, no flank tenderness to palpation  Lab Results  Component Value Date   WBC 9.4 03/09/2014   HGB 14.2 03/09/2014   HCT 39.9 03/09/2014   PLT 271 03/09/2014    Assessment/Plan: UTI, classic symptoms with history of same  Empiric antibiotic x7 days Urine culture for identification and sensitivities Hydration recommended education provided

## 2015-12-06 LAB — URINALYSIS, ROUTINE W REFLEX MICROSCOPIC
BILIRUBIN URINE: NEGATIVE
Glucose, UA: NEGATIVE
Hgb urine dipstick: NEGATIVE
NITRITE: POSITIVE — AB
Protein, ur: NEGATIVE
SPECIFIC GRAVITY, URINE: 1.026 (ref 1.001–1.035)
pH: 6 (ref 5.0–8.0)

## 2015-12-06 LAB — URINALYSIS, MICROSCOPIC ONLY
CRYSTALS: NONE SEEN [HPF]
Casts: NONE SEEN [LPF]
Yeast: NONE SEEN [HPF]

## 2015-12-08 LAB — URINE CULTURE: Colony Count: >=100000

## 2016-06-28 HISTORY — DX: Maternal care for unspecified type scar from previous cesarean delivery: O34.219

## 2016-07-25 ENCOUNTER — Encounter: Payer: Self-pay | Admitting: Student

## 2016-07-25 ENCOUNTER — Inpatient Hospital Stay (HOSPITAL_COMMUNITY): Payer: BC Managed Care – PPO

## 2016-07-25 ENCOUNTER — Inpatient Hospital Stay (HOSPITAL_COMMUNITY)
Admission: AD | Admit: 2016-07-25 | Discharge: 2016-07-27 | DRG: 781 | Disposition: A | Discharge status: Home / Self Care | Payer: BC Managed Care – PPO | Source: Ambulatory Visit

## 2016-07-25 DIAGNOSIS — D649 Anemia, unspecified: Secondary | ICD-10-CM | POA: Diagnosis present

## 2016-07-25 DIAGNOSIS — O34211 Maternal care for low transverse scar from previous cesarean delivery: Secondary | ICD-10-CM | POA: Diagnosis present

## 2016-07-25 DIAGNOSIS — O4590 Premature separation of placenta, unspecified, unspecified trimester: Secondary | ICD-10-CM

## 2016-07-25 DIAGNOSIS — O2203 Varicose veins of lower extremity in pregnancy, third trimester: Secondary | ICD-10-CM | POA: Diagnosis present

## 2016-07-25 DIAGNOSIS — O4593 Premature separation of placenta, unspecified, third trimester: Secondary | ICD-10-CM | POA: Diagnosis present

## 2016-07-25 DIAGNOSIS — Z3A30 30 weeks gestation of pregnancy: Secondary | ICD-10-CM

## 2016-07-25 DIAGNOSIS — O4693 Antepartum hemorrhage, unspecified, third trimester: Secondary | ICD-10-CM | POA: Diagnosis present

## 2016-07-25 DIAGNOSIS — O99013 Anemia complicating pregnancy, third trimester: Secondary | ICD-10-CM | POA: Diagnosis present

## 2016-07-25 DIAGNOSIS — O4690 Antepartum hemorrhage, unspecified, unspecified trimester: Secondary | ICD-10-CM | POA: Diagnosis present

## 2016-07-25 LAB — URINALYSIS, ROUTINE W REFLEX MICROSCOPIC
Bilirubin Urine: NEGATIVE
Glucose, UA: NEGATIVE mg/dL
KETONES UR: NEGATIVE mg/dL
Nitrite: NEGATIVE
PH: 7 (ref 5.0–8.0)
Protein, ur: 30 mg/dL — AB
Specific Gravity, Urine: 1.021 (ref 1.005–1.030)

## 2016-07-25 LAB — CBC
HCT: 31.2 % — ABNORMAL LOW (ref 36.0–46.0)
Hemoglobin: 10.9 g/dL — ABNORMAL LOW (ref 12.0–15.0)
MCH: 29.4 pg (ref 26.0–34.0)
MCHC: 34.9 g/dL (ref 30.0–36.0)
MCV: 84.1 fL (ref 78.0–100.0)
PLATELETS: 281 10*3/uL (ref 150–400)
RBC: 3.71 MIL/uL — ABNORMAL LOW (ref 3.87–5.11)
RDW: 13 % (ref 11.5–15.5)
WBC: 10.5 10*3/uL (ref 4.0–10.5)

## 2016-07-25 LAB — FIBRINOGEN: Fibrinogen: 441 mg/dL (ref 210–475)

## 2016-07-25 LAB — APTT: aPTT: 29 seconds (ref 24–36)

## 2016-07-25 LAB — PROTIME-INR
INR: 0.94
PROTHROMBIN TIME: 12.6 s (ref 11.4–15.2)

## 2016-07-25 LAB — PREPARE RBC (CROSSMATCH)

## 2016-07-25 MED ORDER — CALCIUM CARBONATE ANTACID 500 MG PO CHEW: 2.0000 | CHEWABLE_TABLET | ORAL | Status: DC | PRN

## 2016-07-25 MED ORDER — DOCUSATE SODIUM 100 MG PO CAPS
100.0000 mg | ORAL_CAPSULE | Freq: Every day | ORAL | Status: DC
  Administered 2016-07-25: 100 mg via ORAL
  Filled 2016-07-25 (×2): qty 1

## 2016-07-25 MED ORDER — PRENATAL MULTIVITAMIN CH
1.0000 | ORAL_TABLET | Freq: Every day | ORAL | Status: DC
  Administered 2016-07-25 – 2016-07-27 (×3): 1 via ORAL
  Filled 2016-07-25 (×4): qty 1

## 2016-07-25 MED ORDER — LACTATED RINGERS IV BOLUS (SEPSIS)
1000.0000 mL | Freq: Once | INTRAVENOUS | Status: AC
  Administered 2016-07-25: 1000 mL via INTRAVENOUS

## 2016-07-25 MED ORDER — ACETAMINOPHEN 325 MG PO TABS: 650.0000 mg | ORAL_TABLET | ORAL | Status: DC | PRN

## 2016-07-25 MED ORDER — NIFEDIPINE 10 MG PO CAPS
20.0000 mg | ORAL_CAPSULE | Freq: Once | ORAL | Status: AC
  Administered 2016-07-25: 20 mg via ORAL
  Filled 2016-07-25: qty 2

## 2016-07-25 MED ORDER — NIFEDIPINE 10 MG PO CAPS
10.0000 mg | ORAL_CAPSULE | Freq: Once | ORAL | Status: AC
  Administered 2016-07-25: 10 mg via ORAL

## 2016-07-25 MED ORDER — DOCUSATE SODIUM 100 MG PO CAPS
100.0000 mg | ORAL_CAPSULE | Freq: Two times a day (BID) | ORAL | Status: DC
  Administered 2016-07-26 – 2016-07-27 (×2): 100 mg via ORAL
  Filled 2016-07-25 (×3): qty 1

## 2016-07-25 MED ORDER — ZOLPIDEM TARTRATE 5 MG PO TABS
5.0000 mg | ORAL_TABLET | Freq: Every evening | ORAL | Status: DC | PRN
  Administered 2016-07-25 – 2016-07-27 (×2): 5 mg via ORAL
  Filled 2016-07-25 (×2): qty 1

## 2016-07-25 MED ORDER — NIFEDIPINE 10 MG PO CAPS
10.0000 mg | ORAL_CAPSULE | Freq: Four times a day (QID) | ORAL | Status: DC
  Administered 2016-07-25 – 2016-07-27 (×9): 10 mg via ORAL
  Filled 2016-07-25 (×10): qty 1

## 2016-07-25 MED ORDER — POLYSACCHARIDE IRON COMPLEX 150 MG PO CAPS
150.0000 mg | ORAL_CAPSULE | Freq: Every day | ORAL | Status: DC
  Administered 2016-07-25 – 2016-07-27 (×3): 150 mg via ORAL
  Filled 2016-07-25 (×4): qty 1

## 2016-07-25 MED ORDER — BETAMETHASONE SOD PHOS & ACET 6 (3-3) MG/ML IJ SUSP
12.0000 mg | INTRAMUSCULAR | Status: AC
  Administered 2016-07-25 – 2016-07-26 (×2): 12 mg via INTRAMUSCULAR
  Filled 2016-07-25 (×2): qty 2

## 2016-07-25 MED ORDER — LACTATED RINGERS IV SOLN
INTRAVENOUS | Status: DC
  Administered 2016-07-25 – 2016-07-26 (×4): via INTRAVENOUS

## 2016-07-25 NOTE — MAU Provider Note (Signed)
History     Chief Complaint  Patient presents with  . Abdominal Cramping  . Vaginal Bleeding   G3P1011 at [redacted]w[redacted]d w/ good prenatal care at [facility] / CNM patient.  Reports spotting noted yesterday evening, then again at midnight noted small amount on pad and some blood in toilet after voiding. Also notes cramping starting at same time, feels mostly in lower pelvis. + FM, no LOF. Reports episode of intercourse less than 24 hrs ago, first time in 2 weeks. Denies trauma to abdomen.  Prenatal course remarkable for hx omphalocele and C/S at 38 wks.   Genetic screening negative NT, [place] normal female anatomy with anterior placenta, no previa and normal CL. Growth sono 07/20/15 showed EFW 92%tile.      Abdominal Cramping   Vaginal Bleeding     OB History    Gravida Para Term Preterm AB Living   3       1 0   SAB TAB Ectopic Multiple Live Births   1              Past Medical History:  Diagnosis Date  . Allergic rhinitis   . Allergy   . GERD (gastroesophageal reflux disease)     Past Surgical History:  Procedure Laterality Date  . CESAREAN SECTION    . MOUTH SURGERY    . NO PAST SURGERIES      Family History  Problem Relation Age of Onset  . Hypertension    . Stroke    . Cancer    . Hyperlipidemia    . Anxiety disorder    . Diabetes Maternal Grandfather   . Hypertension Father   . Anxiety disorder Father     Social History  Substance Use Topics  . Smoking status: Former Games developer  . Smokeless tobacco: Never Used  . Alcohol use No    Allergies:  Allergies  Allergen Reactions  . Promethazine Rash  . Phenergan [Promethazine Hcl]     rash    Prescriptions Prior to Admission  Medication Sig Dispense Refill Last Dose  . famotidine (PEPCID) 10 MG tablet Take 10 mg by mouth 2 (two) times daily.   Past Week at Unknown time  . Prenatal Vit-Fe Fumarate-FA (PRENATAL MULTIVITAMIN) TABS tablet Take 1 tablet by mouth daily at 12 noon.   Past Week at Unknown time  . ciprofloxacin  (CIPRO) 500 MG tablet Take 1 tablet (500 mg total) by mouth 2 (two) times daily. 10 tablet 0     Review of Systems  Genitourinary: Positive for vaginal bleeding.   Physical Exam     Physical Exam:  Vitals:   07/25/16 0150  BP: 117/68  Pulse: 103  Resp: 18  Temp: 97.5 F (36.4 C)     General: NAD, no apparent discomfort with ctx. Abd: Soft, NT GU: SSE with small amount dark blood in vaginal vault and aroud cervix, no lesions,no polyps at cervical os, small amount of bloody cervical mucus noted at os, no active bleeding Cvx closed, long, soft Ext: no edema   FHT 140, mod var, + accels, no decels  Ctx Q 2-4 min, mild, variable duration   ED Course  Procedures   A/P: G3P1011 at [redacted]w[redacted]d, hx previous cesarean section for omphalocele at term  Vaginal bleeding and preterm contractions FHT category 1 UA results appear contaminated by vaginal shedding Blood type O pos  IV LR bolus ongoing Sono now for placental assessment, r/o abruption    Neta Mends, CNM, MSN 07/25/2016,  3:29 AM

## 2016-07-25 NOTE — MAU Note (Addendum)
Pt reports bleeding (spotting)@ 4pm and then again around midnight. At midnight she also notice some blood in the toilet when she used the bathroom. Along with the bleeding at 0000, she also was having some cramping. Pt states that she did have intercourse yesterday morning.

## 2016-07-25 NOTE — Progress Notes (Signed)
Pt notes no bleeding on last 2 times up to bathroom, still some cramping and occasional contractions. Good FM. Multiple questions.   Hosp course- 20mg  procardia load, now 10mg  q hrs U/s- prelim w/o acute findings, cvx 3+ cm, no previa, BREECH GBS pending BMZ started  PE: Vitals:   07/25/16 0511 07/25/16 0544 07/25/16 0753 07/25/16 0821  BP: 110/65 99/60 98/70  112/66  Pulse:  (!) 115 (!) 108 93  Resp:  17 18 16   Temp:  98.5 F (36.9 C) 98.5 F (36.9 C)   TempSrc:  Oral Oral   SpO2:   98%   Weight:      Height:       Gen: well appearing, comfortable CV: RRR Pulm: CTAB Abd: gravid, no RUQ pain, no fundal tenderness GU: deferred, no vaginal bleeding seen LE: varicose and spider veins, NT, no edema  Toco: occasinal irritability, no significant contractions FH: 140s, + accels, 10 beat variability, single possible variable decel just now.   CBC    Component Value Date/Time   WBC 10.5 07/25/2016 0508   RBC 3.71 (L) 07/25/2016 0508   HGB 10.9 (L) 07/25/2016 0508   HCT 31.2 (L) 07/25/2016 0508   PLT 281 07/25/2016 0508   MCV 84.1 07/25/2016 0508   MCH 29.4 07/25/2016 0508   MCHC 34.9 07/25/2016 0508   RDW 13.0 07/25/2016 0508    A/P: 3rd trimester bleeding, unclear etiology, no risk factors for abruption  - Bleeding. Likely small abruption but reactive maternal and fetal status. Will plan 48 hrs observation, complete BMZ and continue Procardia while completing BMZ. After 48 hrs of procardia will stop and observe through early Tues am, if ctx return will d/c home on Procardia, If ctx stop will not send home on Procardia.  - Anemia. Start iron/ colace  - Prophylaxis- SCDs, will add TED hose for spider/ varicose veins.   - reactive fetal testing.   Oluwafemi Villella A. 07/25/2016 10:36 AM

## 2016-07-25 NOTE — H&P (Signed)
OB ADMISSION/ HISTORY & PHYSICAL:  Admission Date: 07/25/2016  1:36 AM  Admit Diagnosis: Pregnancy at 5254w4d, vaginal bleeding and preterm contractions.   Laura Barry is a 31 y.o. female presenting for G3P1011 at 6354w4d [facility]  Reports spotting noted yesterday evening, then again at midnight noted small amount on pad and some blood in toilet after voiding. Also notes cramping starting at same time, feels mostly in lower pelvis. + FM, no LOF. Reports episode of intercourse less than 24 hrs ago, first time in 2 weeks. Denies trauma to abdomen.   Genetic screening negative NT, US normal female anatomy with anterior placenta, no previa and normal CL. Growth sono 07/20/15 showed EFW 92%tile. .  Prenatal History: [id number]: G3P1011   EDC : 09/29/2016, by Other Basis   Prenatal course remarkable for hx omphalocele and C/S at 38 wks.   Prenatal Labs: ABO, Rh:   O pos Antibody:  neg Rubella:   immune RPR:   NR HBsAg:   neg HIV:   neg GBS:   inknown 1 hr Glucola : 89   Medical / Surgical History :  Past medical history:  Past Medical History:  Diagnosis Date  . Allergic rhinitis   . Allergy   . GERD (gastroesophageal reflux disease)      Past surgical history:  Past Surgical History:  Procedure Laterality Date  . CESAREAN SECTION    . MOUTH SURGERY    . NO PAST SURGERIES       Family History:  Family History  Problem Relation Age of Onset  . Hypertension    . Stroke    . Cancer    . Hyperlipidemia    . Anxiety disorder    . Diabetes Maternal Grandfather   . Hypertension Father   . Anxiety disorder Father      Social History:  reports that she has quit smoking. She has never used smokeless tobacco. She reports that she does not drink alcohol or use drugs.   Allergies: Promethazine and Phenergan [promethazine hcl]    Current Medications at time of admission:  Prescriptions Prior to Admission  Medication Sig Dispense Refill Last Dose  .  famotidine (PEPCID) 10 MG tablet Take 10 mg by mouth 2 (two) times daily.   Past Week at Unknown time  . Prenatal Vit-Fe Fumarate-FA (PRENATAL MULTIVITAMIN) TABS tablet Take 1 tablet by mouth daily at 12 noon.   Past Week at Unknown time  . ciprofloxacin (CIPRO) 500 MG tablet Take 1 tablet (500 mg total) by mouth 2 (two) times daily. 10 tablet 0       Review of Systems: ROS As noted above   Physical Exam:  Dilation: Closed Effacement (%): Thick Exam by:: D Reymundo Winship Vitals:   07/25/16 0150  BP: 117/68  Pulse: 103  Resp: 18  Temp: 97.5 F (36.4 C)    General: NAD, no apparent discomfort with ctx. Abd: Soft, NT GU: SSE with small amount dark blood in vaginal vault and aroud cervix, no lesions,no polyps at cervical os, small amount of bloody cervical mucus noted at os, no active bleeding Cvx closed, long, soft Ext: no edema   FHT 140, mod var, + accels, no decels  Ctx Q 2-4 min, mild, variable duration  US results pending, prelim report breech footling, CL 3.7, normal AFI, no placenta previa  Labs:    No results for input(s): WBC, HGB, HCT, PLT in the last 72 hours.   Assessment:  31 y.o. G3P1011 at [redacted]w[redacted]d, hx of C/E 2/2 omphalocele  Vaginal bleeding with preterm contractions, suspect marginal placental abruption FHT category 1  Plan:  Admit to antepartum for observation Betamethasone 12.5 mg x 2 for fetal lung maturation Procardia 20 mg loading dose then 10 mg q 6 hrs T&C x 2 units PRBC Coag labs PT, PTT,  INR, fibrinogen LR  @ 125 cc/hr Continuous EFM NPO except ice chips    Consultant: Dr. Darien Ramus, CNM, MSN 07/25/2016, 4:41 AM

## 2016-07-25 NOTE — MAU Note (Signed)
Spotting around 12n. Some cramping. Spotting is bright red. Bleeding has increased some but does not have to wear pad. Mostly when goes to BR and wipes

## 2016-07-26 LAB — CULTURE, BETA STREP (GROUP B ONLY)

## 2016-07-26 LAB — OB RESULTS CONSOLE GBS: GBS: POSITIVE

## 2016-07-26 MED ORDER — MAGNESIUM OXIDE 400 (241.3 MG) MG PO TABS
200.0000 mg | ORAL_TABLET | Freq: Every day | ORAL | Status: DC
  Administered 2016-07-26 – 2016-07-27 (×2): 200 mg via ORAL
  Filled 2016-07-26 (×3): qty 0.5

## 2016-07-26 NOTE — Progress Notes (Signed)
EFM removed, pt plans to shower.  Pt instructed to inform RN when shower complete, EFM to be reapplied.

## 2016-07-26 NOTE — Progress Notes (Signed)
ANTEPARTUM NOTE - Hospital Day 2 Marginal placental abruption  Subjective: Reports feeling ok - lots of cramping last night but none felt this am Tolerating po intake / no nausea / no vomiting  Bowel habits normal / Voiding QS Report no bleeding - small brownish-pink with wiping in BR Fetal activity: moving normally      Objective: Vital signs: VS: Blood pressure (!) 96/55, pulse 94, temperature 98.1 F (36.7 C), temperature source Oral, resp. rate 18, height 5\' 2"  (1.575 m), weight 69.7 kg (153 lb 9.6 oz), last menstrual period 11/26/2015, SpO2 98 %, unknown if currently breastfeeding.    Physical exam:        General appearance/behavior: calm and comfortable/ NAD       Heart: RR       Lungs clear and unlabored       Abdomen: soft and non-tender / gravid at 30 week size       Extremities: no edema / SCD in place    FHR category 1 - reactive tracing TOCO no ctx  Assessment: 30.[redacted] weeks gestation FHR category 1  Plan:  Continue inpatient management Discussed at DC - OOW x 2 weeks at home (works at [employer] with  High physical requirements)   Marlinda MikeBAILEY, Jarian Longoria CNM, MSN, [facility] 07/26/2016, 8:47 AM

## 2016-07-27 MED ORDER — NIFEDIPINE 10 MG PO CAPS
10.0000 mg | ORAL_CAPSULE | Freq: Four times a day (QID) | ORAL | 3 refills | Status: DC
Start: 2016-07-27 — End: 2016-07-31

## 2016-07-27 NOTE — Discharge Instructions (Signed)
Placental Abruption Placental abruption is a condition in which the placenta partly or completely separates from the uterus before the baby is born. The placenta is the organ that nourishes the unborn baby (fetus). The baby gets his or her blood supply and nutrients through the placenta. It is the baby's life support system. The placenta is attached to the inside of the uterus until after the baby is born. Placental abruption is rare, but it can happen any time after 20 weeks of pregnancy. A small separation may not cause problems, but a large separation may be dangerous for you and your baby. A large separation is usually an emergency. It requires treatment right away. What are the causes? In most cases, the cause of this condition is not known. What increases the risk? This condition is more likely to develop in women who:  Have experienced a recent trauma such as a fall, an abdominal injury, or a car accident.  Have a previous placental abruption.  Have high blood pressure (hypertension).  Smoke cigarettes, use alcohol, or use illegal drugs such as cocaine.  Have blood clotting problems.  Experience preterm premature rupture of membranes (PPROM).  Have multiples (twins, triplets, or more).  Have had children before.  Are 35 years of age or older. What are the signs or symptoms? Symptoms of this condition can vary from mild to severe. A small placental abruption may not cause symptoms, or it may cause mild symptoms, which may include:  Mild abdominal pain or lower back pain.  Slight vaginal bleeding. A severe placental abruption will cause symptoms. The symptoms will depend on the size of the separation and the stage of pregnancy. They may include:  Abdominal pain or lower back pain.  Vaginal bleeding.  Tender and hard uterus.  Severe abdominal pain with tenderness.  Continual contractions of your uterus.  Weakness and light-headedness. How is this diagnosed? This  condition may be diagnosed based on:  Your symptoms.  A physical exam.  Ultrasound.  Blood work. This will be done to make sure that there are enough healthy red blood cells and that there are no clotting problems or signs of too much blood loss. How is this treated? Treatment for placental abruption depends on the severity of the condition. For mild cases, treatment may involve monitoring your condition and managing your symptoms. This may involve:  Bed rest and close observation. For more severe cases, emergency treatment is needed. This may involve:  Staying in the hospital until you and your baby are stabilized.  Cesarean delivery of your baby.  A blood transfusion or other fluids given through an IV tube.  Other treatments, depending on:  The amount of bleeding you have.  Whether you or your baby are in distress.  The stage of your pregnancy.  The maturity of the baby. Follow these instructions at home:  Take over-the-counter and prescription medicines only as told by your health care provider. Do not take any medicines that your health care provider has not approved.  Arrange for help at home before and after you deliver your baby, especially if you had a cesarean delivery or if you lost a lot of blood.  Get plenty of rest and sleep.  Do not use illegal drugs.  Do not drink alcohol.  Do not have sexual intercourse until your health care provider says it is okay.  Do not use tampons or douche unless your health care provider says it is okay.  Do not use any products that   contain nicotine or tobacco, such as cigarettes and e-cigarettes. If you need help quitting, ask your health care provider. Get help right away if:  You have vaginal bleeding or spotting.  You have any type of trauma, such as a fall, abdominal trauma, or a car accident.  You have abdominal pain.  You have continuous uterine contractions.  You have a hard, tender uterus.  You do not  feel the baby move, or the baby moves very little. This information is not intended to replace advice given to you by your health care provider. Make sure you discuss any questions you have with your health care provider. Document Released: 06/14/2005 Document Revised: 02/12/2016 Document Reviewed: 01/04/2016 Elsevier Interactive Patient Education  2017 [employer]vier Inc.  

## 2016-07-27 NOTE — Progress Notes (Signed)
Discharge instructions given to patient. Patient verbalized an understanding of discharge instructions.

## 2016-07-27 NOTE — Progress Notes (Signed)
ANTEPARTUM NOTE - Hospital Day 3 Marginal placental abruption at 30 6/7 wks Previous C/S for TOLAC.  Transverse presentation.  Subjective: Reports feeling ok - No cramping felt x yesterday, no UC felt Tolerating po intake / no nausea / no vomiting  Bowel habits normal / Voiding QS Reports no bleeding - small brownish rarely with wiping yesterday, none today. Fetal activity: moving normally      Objective: Vital Signs    Report    01/29 0700 01/30 0659 01/30 0700 01/30 1039  Most Recent    Temp (F)    97.5-98 98.6  98.6 (37)  01/30 0800  Pulse Rate    90-111 93  93  01/30 0800  Resp    18-20 20  20   01/30 0800  BP    93/64-114/70 79/43  79/43  01/30 0800  SpO2 (%)    97-99   97  01/29 2000      Physical exam:        General appearance/behavior: calm and comfortable/ NAD       Heart: RR       Lungs clear       Abdomen: soft and non-tender / gravid at 30 week size       Extremities: no edema / SCD in place    FHR category 1 today - reactive tracing, no deceleration, BLine 140-150's TOCO no ctx  US 1/28th Placenta Ant Normal.  AFI wnl.  Transverse presentation.  Cervix long/closed. US EFW 92% 1/22nd Ultrascreen wnl.  US anato wnl.  Assessment: 30 6/[redacted] weeks gestation with Marginal Abruption.  No recent bleeding.  No evidence of PTL. FHR category 1  Plan:  D/C home on pelvic rest/relative rest and OOW x 2 weeks (works at [employer] with  High physical requirements) Procardia.  F/U W.Ob-Gyn end of this week.  Bleeding/PTL precautions discussed.  Genia DelMarie-Lyne Emmet Messer MD  07/27/2016 at 10:39 am

## 2016-07-27 NOTE — Discharge Summary (Signed)
ANTENATAL DISCHARGE SUMMARY  Patient ID: Laura FramesKati B Lamy MRN: [id number] DOB/AGE: 29-May-1986 31 y.o.  Admit date: 07/25/2016 Discharge date: 07/27/2016  Admission Diagnoses: 30 weeks cramping and bleeding  Discharge Diagnoses: 30 weeks cramping and bleeding Jenna Luo/Resolved        Discharged Condition: good  Hospital Course: good  Consults: None and MFM  Treatments: IV hydration, steroids: Beta-Methasone and Procardia  Disposition: home   Allergies as of 07/27/2016      Reactions   Promethazine Rash      Medication List    TAKE these medications   DICLEGIS 10-10 MG Tbec Generic drug:  Doxylamine-Pyridoxine Take 1-2 tablets by mouth 2 (two) times daily as needed (for nausea).   famotidine 10 MG tablet Commonly known as:  PEPCID Take 10 mg by mouth 2 (two) times daily as needed for heartburn or indigestion.   NIFEdipine 10 MG capsule Commonly known as:  PROCARDIA Take 1 capsule (10 mg total) by mouth every 6 (six) hours.   PRENATE PIXIE 10-0.6-0.4-200 MG Caps Take 1 capsule by mouth daily.      Follow-up Information    Marlinda MikeBAILEY, TANYA, CNM Follow up.   Specialty:  Obstetrics and Gynecology Contact information: [address] [place] [address] [phone]        Marlinda MikeBAILEY, TANYA, CNM .   Specialty:  Obstetrics and Gynecology Contact information: [address] [place] [address] [phone]           Signed: Genia DelMarie-Lyne Jasey Cortez, MD MD 07/27/2016, 12:23 PM

## 2016-07-29 LAB — TYPE AND SCREEN
BLOOD PRODUCT EXPIRATION DATE: 201803022359
BLOOD PRODUCT EXPIRATION DATE: 201803022359
Blood Product Expiration Date: 201802262359
Blood Product Expiration Date: 201802262359
ISSUE DATE / TIME: 201801281842
ISSUE DATE / TIME: 201801282052
UNIT TYPE AND RH: 5100
Unit Type and Rh: 5100
Unit Type and Rh: 5100
Unit Type and Rh: 5100

## 2016-07-31 ENCOUNTER — Encounter (HOSPITAL_COMMUNITY)
Admission: AD | Disposition: A | Discharge status: Home / Self Care | Payer: Self-pay | Source: Ambulatory Visit | Attending: Obstetrics and Gynecology

## 2016-07-31 ENCOUNTER — Inpatient Hospital Stay (HOSPITAL_COMMUNITY): Payer: BC Managed Care – PPO | Admitting: Certified Registered Nurse Anesthetist

## 2016-07-31 ENCOUNTER — Inpatient Hospital Stay (HOSPITAL_COMMUNITY)
Admission: AD | Admit: 2016-07-31 | Discharge: 2016-08-02 | DRG: 765 | Disposition: A | Discharge status: Home / Self Care | Payer: BC Managed Care – PPO | Source: Ambulatory Visit | Attending: Obstetrics and Gynecology | Admitting: Obstetrics and Gynecology

## 2016-07-31 ENCOUNTER — Encounter (HOSPITAL_COMMUNITY): Payer: Self-pay

## 2016-07-31 DIAGNOSIS — O99824 Streptococcus B carrier state complicating childbirth: Secondary | ICD-10-CM | POA: Diagnosis present

## 2016-07-31 DIAGNOSIS — Z87891 Personal history of nicotine dependence: Secondary | ICD-10-CM

## 2016-07-31 DIAGNOSIS — O4590 Premature separation of placenta, unspecified, unspecified trimester: Secondary | ICD-10-CM | POA: Diagnosis present

## 2016-07-31 DIAGNOSIS — O321XX0 Maternal care for breech presentation, not applicable or unspecified: Secondary | ICD-10-CM

## 2016-07-31 DIAGNOSIS — Z823 Family history of stroke: Secondary | ICD-10-CM | POA: Diagnosis not present

## 2016-07-31 DIAGNOSIS — O34211 Maternal care for low transverse scar from previous cesarean delivery: Secondary | ICD-10-CM | POA: Diagnosis present

## 2016-07-31 DIAGNOSIS — Z833 Family history of diabetes mellitus: Secondary | ICD-10-CM | POA: Diagnosis not present

## 2016-07-31 DIAGNOSIS — O42013 Preterm premature rupture of membranes, onset of labor within 24 hours of rupture, third trimester: Secondary | ICD-10-CM

## 2016-07-31 DIAGNOSIS — O42919 Preterm premature rupture of membranes, unspecified as to length of time between rupture and onset of labor, unspecified trimester: Secondary | ICD-10-CM

## 2016-07-31 DIAGNOSIS — O34219 Maternal care for unspecified type scar from previous cesarean delivery: Secondary | ICD-10-CM | POA: Diagnosis not present

## 2016-07-31 DIAGNOSIS — Q513 Bicornate uterus: Secondary | ICD-10-CM | POA: Diagnosis not present

## 2016-07-31 DIAGNOSIS — O322XX0 Maternal care for transverse and oblique lie, not applicable or unspecified: Secondary | ICD-10-CM | POA: Diagnosis not present

## 2016-07-31 DIAGNOSIS — Z8249 Family history of ischemic heart disease and other diseases of the circulatory system: Secondary | ICD-10-CM

## 2016-07-31 DIAGNOSIS — O4690 Antepartum hemorrhage, unspecified, unspecified trimester: Secondary | ICD-10-CM | POA: Diagnosis not present

## 2016-07-31 DIAGNOSIS — O3403 Maternal care for unspecified congenital malformation of uterus, third trimester: Secondary | ICD-10-CM | POA: Diagnosis present

## 2016-07-31 DIAGNOSIS — Z3A31 31 weeks gestation of pregnancy: Secondary | ICD-10-CM

## 2016-07-31 DIAGNOSIS — K219 Gastro-esophageal reflux disease without esophagitis: Secondary | ICD-10-CM | POA: Diagnosis present

## 2016-07-31 DIAGNOSIS — O4593 Premature separation of placenta, unspecified, third trimester: Principal | ICD-10-CM | POA: Diagnosis present

## 2016-07-31 DIAGNOSIS — O9962 Diseases of the digestive system complicating childbirth: Secondary | ICD-10-CM | POA: Diagnosis present

## 2016-07-31 HISTORY — DX: Premature separation of placenta, unspecified, unspecified trimester: O45.90

## 2016-07-31 HISTORY — DX: Maternal care for breech presentation, not applicable or unspecified: O32.1XX0

## 2016-07-31 HISTORY — DX: Preterm premature rupture of membranes, unspecified as to length of time between rupture and onset of labor, unspecified trimester: O42.919

## 2016-07-31 LAB — CBC WITH DIFFERENTIAL/PLATELET
BASOS ABS: 0 10*3/uL (ref 0.0–0.1)
BASOS PCT: 0 %
EOS PCT: 0 %
Eosinophils Absolute: 0.1 10*3/uL (ref 0.0–0.7)
HEMATOCRIT: 37 % (ref 36.0–46.0)
Hemoglobin: 13.1 g/dL (ref 12.0–15.0)
Lymphocytes Relative: 21 %
Lymphs Abs: 2.8 10*3/uL (ref 0.7–4.0)
MCH: 29.8 pg (ref 26.0–34.0)
MCHC: 35.4 g/dL (ref 30.0–36.0)
MCV: 84.1 fL (ref 78.0–100.0)
MONO ABS: 0.7 10*3/uL (ref 0.1–1.0)
Monocytes Relative: 5 %
NEUTROS ABS: 9.7 10*3/uL — AB (ref 1.7–7.7)
Neutrophils Relative %: 74 %
PLATELETS: 369 10*3/uL (ref 150–400)
RBC: 4.4 MIL/uL (ref 3.87–5.11)
RDW: 13 % (ref 11.5–15.5)
WBC: 13.3 10*3/uL — ABNORMAL HIGH (ref 4.0–10.5)

## 2016-07-31 LAB — TYPE AND SCREEN
ABO/RH(D): O POS
ANTIBODY SCREEN: NEGATIVE

## 2016-07-31 LAB — POCT FERN TEST: POCT FERN TEST: POSITIVE

## 2016-07-31 SURGERY — Surgical Case
Anesthesia: Spinal

## 2016-07-31 MED ORDER — MEPERIDINE HCL 25 MG/ML IJ SOLN: 6.2500 mg | INTRAMUSCULAR | Status: DC | PRN

## 2016-07-31 MED ORDER — METHYLERGONOVINE MALEATE 0.2 MG PO TABS: 0.2000 mg | ORAL_TABLET | ORAL | Status: DC | PRN

## 2016-07-31 MED ORDER — KETOROLAC TROMETHAMINE 30 MG/ML IJ SOLN: 30.0000 mg | Freq: Four times a day (QID) | INTRAMUSCULAR | Status: DC | PRN

## 2016-07-31 MED ORDER — DIPHENHYDRAMINE HCL 25 MG PO CAPS
25.0000 mg | ORAL_CAPSULE | ORAL | Status: DC | PRN
  Filled 2016-07-31: qty 1

## 2016-07-31 MED ORDER — BUPIVACAINE HCL (PF) 0.25 % IJ SOLN
INTRAMUSCULAR | Status: AC
  Filled 2016-07-31: qty 20

## 2016-07-31 MED ORDER — KETOROLAC TROMETHAMINE 30 MG/ML IJ SOLN
30.0000 mg | Freq: Four times a day (QID) | INTRAMUSCULAR | Status: DC | PRN
  Administered 2016-07-31: 30 mg via INTRAMUSCULAR

## 2016-07-31 MED ORDER — SIMETHICONE 80 MG PO CHEW
80.0000 mg | CHEWABLE_TABLET | ORAL | Status: DC
  Administered 2016-08-01: 80 mg via ORAL
  Filled 2016-07-31: qty 1

## 2016-07-31 MED ORDER — ONDANSETRON HCL 4 MG/2ML IJ SOLN
4.0000 mg | Freq: Four times a day (QID) | INTRAMUSCULAR | Status: DC | PRN
Start: 2016-07-31 — End: 2016-08-02
  Administered 2016-07-31: 4 mg via INTRAVENOUS
  Filled 2016-07-31: qty 2

## 2016-07-31 MED ORDER — NALOXONE HCL 0.4 MG/ML IJ SOLN: 0.4000 mg | INTRAMUSCULAR | Status: DC | PRN

## 2016-07-31 MED ORDER — DIBUCAINE 1 % RE OINT: 1.0000 "application " | TOPICAL_OINTMENT | RECTAL | Status: DC | PRN

## 2016-07-31 MED ORDER — WITCH HAZEL-GLYCERIN EX PADS: 1.0000 "application " | MEDICATED_PAD | CUTANEOUS | Status: DC | PRN

## 2016-07-31 MED ORDER — OXYCODONE-ACETAMINOPHEN 5-325 MG PO TABS
1.0000 | ORAL_TABLET | ORAL | Status: DC | PRN
  Administered 2016-08-02 (×2): 1 via ORAL
  Filled 2016-07-31 (×3): qty 1

## 2016-07-31 MED ORDER — FENTANYL CITRATE (PF) 100 MCG/2ML IJ SOLN
INTRAMUSCULAR | Status: AC
  Filled 2016-07-31: qty 2

## 2016-07-31 MED ORDER — MAGNESIUM SULFATE 2 GM/50ML IV SOLN
2.0000 g | Freq: Once | INTRAVENOUS | Status: DC
  Filled 2016-07-31: qty 50

## 2016-07-31 MED ORDER — SIMETHICONE 80 MG PO CHEW: 80.0000 mg | CHEWABLE_TABLET | ORAL | Status: DC | PRN

## 2016-07-31 MED ORDER — LACTATED RINGERS IV SOLN
INTRAVENOUS | Status: DC | PRN
  Administered 2016-07-31: 13:00:00 via INTRAVENOUS

## 2016-07-31 MED ORDER — ONDANSETRON HCL 4 MG/5ML PO SOLN
4.0000 mg | Freq: Four times a day (QID) | ORAL | Status: DC | PRN
  Filled 2016-07-31: qty 5

## 2016-07-31 MED ORDER — ONDANSETRON HCL 4 MG/2ML IJ SOLN: 4.0000 mg | Freq: Once | INTRAMUSCULAR | Status: DC | PRN

## 2016-07-31 MED ORDER — ONDANSETRON HCL 4 MG/2ML IJ SOLN: 4.0000 mg | Freq: Three times a day (TID) | INTRAMUSCULAR | Status: DC | PRN

## 2016-07-31 MED ORDER — SOD CITRATE-CITRIC ACID 500-334 MG/5ML PO SOLN
30.0000 mL | Freq: Once | ORAL | Status: AC
  Administered 2016-07-31: 30 mL via ORAL
  Filled 2016-07-31: qty 15

## 2016-07-31 MED ORDER — MORPHINE SULFATE (PF) 0.5 MG/ML IJ SOLN
INTRAMUSCULAR | Status: DC | PRN
  Administered 2016-07-31: .2 mg via INTRATHECAL

## 2016-07-31 MED ORDER — DIPHENHYDRAMINE HCL 50 MG/ML IJ SOLN: 12.5000 mg | INTRAMUSCULAR | Status: DC | PRN

## 2016-07-31 MED ORDER — NALBUPHINE HCL 10 MG/ML IJ SOLN: 5.0000 mg | Freq: Once | INTRAMUSCULAR | Status: DC | PRN

## 2016-07-31 MED ORDER — ONDANSETRON HCL 4 MG/2ML IJ SOLN
INTRAMUSCULAR | Status: DC | PRN
  Administered 2016-07-31: 4 mg via INTRAVENOUS

## 2016-07-31 MED ORDER — OXYTOCIN 10 UNIT/ML IJ SOLN
INTRAVENOUS | Status: DC | PRN
  Administered 2016-07-31: 40 [IU] via INTRAVENOUS

## 2016-07-31 MED ORDER — LACTATED RINGERS IV SOLN
INTRAVENOUS | Status: DC
  Administered 2016-08-01 (×2): via INTRAVENOUS

## 2016-07-31 MED ORDER — MORPHINE SULFATE (PF) 0.5 MG/ML IJ SOLN
INTRAMUSCULAR | Status: AC
  Filled 2016-07-31: qty 10

## 2016-07-31 MED ORDER — LACTATED RINGERS IV SOLN
INTRAVENOUS | Status: DC | PRN
  Administered 2016-07-31 (×2): via INTRAVENOUS

## 2016-07-31 MED ORDER — NALBUPHINE HCL 10 MG/ML IJ SOLN: 5.0000 mg | INTRAMUSCULAR | Status: DC | PRN

## 2016-07-31 MED ORDER — SENNOSIDES-DOCUSATE SODIUM 8.6-50 MG PO TABS
2.0000 | ORAL_TABLET | ORAL | Status: DC
  Administered 2016-08-01: 2 via ORAL
  Filled 2016-07-31: qty 2

## 2016-07-31 MED ORDER — ACETAMINOPHEN 325 MG PO TABS: 650.0000 mg | ORAL_TABLET | ORAL | Status: DC | PRN

## 2016-07-31 MED ORDER — MENTHOL 3 MG MT LOZG: 1.0000 | LOZENGE | OROMUCOSAL | Status: DC | PRN

## 2016-07-31 MED ORDER — TETANUS-DIPHTH-ACELL PERTUSSIS 5-2.5-18.5 LF-MCG/0.5 IM SUSP: 0.5000 mL | Freq: Once | INTRAMUSCULAR | Status: DC

## 2016-07-31 MED ORDER — LACTATED RINGERS IV BOLUS (SEPSIS)
1000.0000 mL | Freq: Once | INTRAVENOUS | Status: AC
  Administered 2016-07-31: 1000 mL via INTRAVENOUS

## 2016-07-31 MED ORDER — OXYTOCIN 10 UNIT/ML IJ SOLN
INTRAMUSCULAR | Status: AC
  Filled 2016-07-31: qty 4

## 2016-07-31 MED ORDER — OXYTOCIN 40 UNITS IN LACTATED RINGERS INFUSION - SIMPLE MED: 2.5000 [IU]/h | INTRAVENOUS | Status: AC

## 2016-07-31 MED ORDER — FENTANYL CITRATE (PF) 100 MCG/2ML IJ SOLN
INTRAMUSCULAR | Status: DC | PRN
  Administered 2016-07-31: 10 ug via INTRATHECAL

## 2016-07-31 MED ORDER — BUPIVACAINE IN DEXTROSE 0.75-8.25 % IT SOLN
INTRATHECAL | Status: DC | PRN
  Administered 2016-07-31: 1.6 mL via INTRATHECAL

## 2016-07-31 MED ORDER — COCONUT OIL OIL
1.0000 "application " | TOPICAL_OIL | Status: DC | PRN
  Administered 2016-08-01: 1 via TOPICAL
  Filled 2016-07-31 (×2): qty 120

## 2016-07-31 MED ORDER — ACETAMINOPHEN 500 MG PO TABS: 1000.0000 mg | ORAL_TABLET | Freq: Four times a day (QID) | ORAL | Status: DC

## 2016-07-31 MED ORDER — IBUPROFEN 600 MG PO TABS
600.0000 mg | ORAL_TABLET | Freq: Four times a day (QID) | ORAL | Status: DC
  Administered 2016-08-01 – 2016-08-02 (×6): 600 mg via ORAL
  Filled 2016-07-31 (×8): qty 1

## 2016-07-31 MED ORDER — OXYCODONE-ACETAMINOPHEN 5-325 MG PO TABS
2.0000 | ORAL_TABLET | ORAL | Status: DC | PRN
  Administered 2016-08-01 – 2016-08-02 (×3): 2 via ORAL
  Filled 2016-07-31 (×2): qty 2

## 2016-07-31 MED ORDER — PHENYLEPHRINE 8 MG IN D5W 100 ML (0.08MG/ML) PREMIX OPTIME
INJECTION | INTRAVENOUS | Status: DC | PRN
  Administered 2016-07-31: 60 ug/min via INTRAVENOUS

## 2016-07-31 MED ORDER — ONDANSETRON HCL 4 MG/2ML IJ SOLN
INTRAMUSCULAR | Status: AC
  Filled 2016-07-31: qty 2

## 2016-07-31 MED ORDER — METHYLERGONOVINE MALEATE 0.2 MG/ML IJ SOLN: 0.2000 mg | INTRAMUSCULAR | Status: DC | PRN

## 2016-07-31 MED ORDER — LACTATED RINGERS IV BOLUS (SEPSIS): 1000.0000 mL | Freq: Once | INTRAVENOUS | Status: DC

## 2016-07-31 MED ORDER — CEFAZOLIN SODIUM-DEXTROSE 2-4 GM/100ML-% IV SOLN
2.0000 g | Freq: Once | INTRAVENOUS | Status: AC
  Administered 2016-07-31: 2 g via INTRAVENOUS
  Filled 2016-07-31: qty 100

## 2016-07-31 MED ORDER — FENTANYL CITRATE (PF) 100 MCG/2ML IJ SOLN
25.0000 ug | INTRAMUSCULAR | Status: DC | PRN
  Administered 2016-07-31: 50 ug via INTRAVENOUS

## 2016-07-31 MED ORDER — FAMOTIDINE IN NACL 20-0.9 MG/50ML-% IV SOLN
20.0000 mg | Freq: Once | INTRAVENOUS | Status: AC
  Administered 2016-07-31: 20 mg via INTRAVENOUS
  Filled 2016-07-31: qty 50

## 2016-07-31 MED ORDER — NALOXONE HCL 2 MG/2ML IJ SOSY
1.0000 ug/kg/h | PREFILLED_SYRINGE | INTRAVENOUS | Status: DC | PRN
  Filled 2016-07-31: qty 2

## 2016-07-31 MED ORDER — PHENYLEPHRINE 8 MG IN D5W 100 ML (0.08MG/ML) PREMIX OPTIME
INJECTION | INTRAVENOUS | Status: AC
  Filled 2016-07-31: qty 100

## 2016-07-31 MED ORDER — SODIUM CHLORIDE 0.9% FLUSH: 3.0000 mL | INTRAVENOUS | Status: DC | PRN

## 2016-07-31 MED ORDER — KETOROLAC TROMETHAMINE 30 MG/ML IJ SOLN
INTRAMUSCULAR | Status: AC
  Filled 2016-07-31: qty 1

## 2016-07-31 MED ORDER — DIPHENHYDRAMINE HCL 25 MG PO CAPS: 25.0000 mg | ORAL_CAPSULE | Freq: Four times a day (QID) | ORAL | Status: DC | PRN

## 2016-07-31 MED ORDER — ZOLPIDEM TARTRATE 5 MG PO TABS: 5.0000 mg | ORAL_TABLET | Freq: Every evening | ORAL | Status: DC | PRN

## 2016-07-31 MED ORDER — BUPIVACAINE HCL (PF) 0.25 % IJ SOLN
INTRAMUSCULAR | Status: DC | PRN
  Administered 2016-07-31: 20 mL

## 2016-07-31 MED ORDER — PRENATAL MULTIVITAMIN CH
1.0000 | ORAL_TABLET | Freq: Every day | ORAL | Status: DC
  Administered 2016-08-01 – 2016-08-02 (×2): 1 via ORAL
  Filled 2016-07-31 (×2): qty 1

## 2016-07-31 MED ORDER — SIMETHICONE 80 MG PO CHEW
80.0000 mg | CHEWABLE_TABLET | Freq: Three times a day (TID) | ORAL | Status: DC
  Administered 2016-08-01 – 2016-08-02 (×5): 80 mg via ORAL
  Filled 2016-07-31 (×7): qty 1

## 2016-07-31 SURGICAL SUPPLY — 36 items
ADH SKN CLS APL DERMABOND .7 (GAUZE/BANDAGES/DRESSINGS) ×1
CHLORAPREP W/TINT 26ML (MISCELLANEOUS) ×3 IMPLANT
CLAMP CORD UMBIL (MISCELLANEOUS) IMPLANT
CLOTH BEACON ORANGE TIMEOUT ST (SAFETY) ×3 IMPLANT
CONTAINER PREFILL 10% NBF 15ML (MISCELLANEOUS) IMPLANT
DERMABOND ADVANCED (GAUZE/BANDAGES/DRESSINGS) ×2
DERMABOND ADVANCED .7 DNX12 (GAUZE/BANDAGES/DRESSINGS) ×1 IMPLANT
DRSG OPSITE POSTOP 4X10 (GAUZE/BANDAGES/DRESSINGS) ×3 IMPLANT
ELECT REM PT RETURN 9FT ADLT (ELECTROSURGICAL) ×3
ELECTRODE REM PT RTRN 9FT ADLT (ELECTROSURGICAL) ×1 IMPLANT
EXTRACTOR VACUUM M CUP 4 TUBE (SUCTIONS) IMPLANT
EXTRACTOR VACUUM M CUP 4' TUBE (SUCTIONS)
GLOVE BIO SURGEON STRL SZ7.5 (GLOVE) ×3 IMPLANT
GLOVE BIOGEL PI IND STRL 7.0 (GLOVE) ×1 IMPLANT
GLOVE BIOGEL PI INDICATOR 7.0 (GLOVE) ×2
GOWN STRL REUS W/TWL LRG LVL3 (GOWN DISPOSABLE) ×6 IMPLANT
KIT ABG SYR 3ML LUER SLIP (SYRINGE) IMPLANT
NEEDLE HYPO 22GX1.5 SAFETY (NEEDLE) ×3 IMPLANT
NEEDLE HYPO 25X5/8 SAFETYGLIDE (NEEDLE) IMPLANT
NEEDLE SPNL 20GX3.5 QUINCKE YW (NEEDLE) IMPLANT
NS IRRIG 1000ML POUR BTL (IV SOLUTION) ×3 IMPLANT
PACK C SECTION WH (CUSTOM PROCEDURE TRAY) ×3 IMPLANT
PENCIL SMOKE EVAC W/HOLSTER (ELECTROSURGICAL) ×3 IMPLANT
SUT MNCRL 0 VIOLET CTX 36 (SUTURE) ×2 IMPLANT
SUT MNCRL AB 3-0 PS2 27 (SUTURE) IMPLANT
SUT MON AB 2-0 CT1 27 (SUTURE) ×3 IMPLANT
SUT MON AB-0 CT1 36 (SUTURE) ×6 IMPLANT
SUT MONOCRYL 0 CTX 36 (SUTURE) ×4
SUT PLAIN 0 NONE (SUTURE) IMPLANT
SUT PLAIN 2 0 (SUTURE)
SUT PLAIN 2 0 XLH (SUTURE) IMPLANT
SUT PLAIN ABS 2-0 CT1 27XMFL (SUTURE) IMPLANT
SYR 20CC LL (SYRINGE) IMPLANT
SYR CONTROL 10ML LL (SYRINGE) ×3 IMPLANT
TOWEL OR 17X24 6PK STRL BLUE (TOWEL DISPOSABLE) ×3 IMPLANT
TRAY FOLEY CATH SILVER 14FR (SET/KITS/TRAYS/PACK) ×3 IMPLANT

## 2016-07-31 NOTE — Transfer of Care (Signed)
Immediate Anesthesia Transfer of Care Note  Patient: Laura Barry  Procedure(s) Performed: Procedure(s): CESAREAN SECTION (N/A)  Patient Location: PACU  Anesthesia Type:Spinal  Level of Consciousness: awake, alert  and oriented  Airway & Oxygen Therapy: Patient Spontanous Breathing  Post-op Assessment: Report given to RN and Post -op Vital signs reviewed and stable  Post vital signs: Reviewed and stable  Last Vitals:  Vitals:   07/31/16 1301 07/31/16 1416  BP:  107/75  Pulse: 97 (!) 102  Resp:  (!) 21  Temp:  36.6 C    Last Pain:  Vitals:   07/31/16 1416  TempSrc: Oral  PainSc:          Complications: No apparent anesthesia complications

## 2016-07-31 NOTE — MAU Note (Signed)
Pt was admitted previously for ctx and bleeding. The bleeding and ctx has started again and bleeding has increased.

## 2016-07-31 NOTE — Anesthesia Preprocedure Evaluation (Signed)
Anesthesia Evaluation  Patient identified by MRN, date of birth, ID band Patient awake    Reviewed: Allergy & Precautions, NPO status , Patient's Chart, lab work & pertinent test results  Airway Mallampati: II  TM Distance: >3 FB Neck ROM: Full    Dental  (+) Teeth Intact, Dental Advisory Given   Pulmonary neg pulmonary ROS, former smoker,    Pulmonary exam normal breath sounds clear to auscultation       Cardiovascular negative cardio ROS Normal cardiovascular exam Rhythm:Regular Rate:Normal     Neuro/Psych negative neurological ROS     GI/Hepatic Neg liver ROS, GERD  Medicated,  Endo/Other  negative endocrine ROS  Renal/GU negative Renal ROS     Musculoskeletal negative musculoskeletal ROS (+)   Abdominal   Peds  Hematology  (+) Blood dyscrasia, anemia , Plt 281k on 07/25/16   Anesthesia Other Findings Day of surgery medications reviewed with the patient.  Reproductive/Obstetrics (+) Pregnancy H/o C-section x1                             Anesthesia Physical Anesthesia Plan  ASA: II and emergent  Anesthesia Plan: Spinal   Post-op Pain Management:    Induction:   Airway Management Planned:   Additional Equipment:   Intra-op Plan:   Post-operative Plan:   Informed Consent: I have reviewed the patients History and Physical, chart, labs and discussed the procedure including the risks, benefits and alternatives for the proposed anesthesia with the patient or authorized representative who has indicated his/her understanding and acceptance.   Dental advisory given  Plan Discussed with: CRNA, Anesthesiologist and Surgeon  Anesthesia Plan Comments: (Discussed risks and benefits of and differences between spinal and general. Discussed risks of spinal including headache, backache, failure, bleeding, infection, and nerve damage. Patient consents to spinal. Questions answered.  Coagulation studies and platelet count acceptable.)        Anesthesia Quick Evaluation

## 2016-07-31 NOTE — Op Note (Signed)
Cesarean Section Procedure Note  Indications: abruptio placenta, malpresentation: complete breech and previous uterine incision kerr x one  SROM PTL  Pre-operative Diagnosis: 31 week 3 day pregnancy.  Post-operative Diagnosis: same  Surgeon: Lenoard AdenAAVON,Shaira Sova J   Assistants: Arita Missawson, CNM  Anesthesia: Local anesthesia 0.25.% bupivacaine and Spinal anesthesia  ASA Class: 2  Procedure Details  The patient was seen in the Holding Room. The risks, benefits, complications, treatment options, and expected outcomes were discussed with the patient.  The patient concurred with the proposed plan, giving informed consent. The risks of anesthesia, infection, bleeding and possible injury to other organs discussed. Injury to bowel, bladder, or ureter with possible need for repair discussed. Possible need for transfusion with secondary risks of hepatitis or HIV acquisition discussed. Post operative complications to include but not limited to DVT, PE and Pneumonia noted. The site of surgery properly noted/marked. The patient was taken to Operating Room # 9, identified as Andria FramesKati B Detwiler and the procedure verified as C-Section Delivery. A Time Out was held and the above information confirmed.  After induction of anesthesia, the patient was draped and prepped in the usual sterile manner. A Pfannenstiel incision was made and carried down through the subcutaneous tissue to the fascia. Fascial incision was made and extended transversely using Mayo scissors. The fascia was separated from the underlying rectus tissue superiorly and inferiorly. The peritoneum was identified and entered. Peritoneal incision was extended longitudinally. Omental adhesions to anterior abdominal wall lysed sharply. The utero-vesical peritoneal reflection which was densely adherent  was incised transversely and the bladder flap was bluntly freed from the lower uterine segment. A low transverse uterine incision(Kerr hysterotomy) was made. Delivered  from complete breech presentation was a  female with Apgar scores of 6 at one minute and 8 at five minutes. RN unable to collect cord ph.  Bulb suctioning gently performed. Neonatal team in attendance.After the umbilical cord was clamped(delayed x one minute) and cut cord blood was obtained for evaluation. The placenta was removed intact and appeared abnormal with marginal abruptio suspected. The uterus was curetted with a dry lap pack. Good hemostasis was noted.The uterine outline, tubes and ovaries appeared abnormal with ? Bicornuate shape. The uterine incision was closed with running locked sutures of 0 Monocryl x 2 layers. Interrupted sutures at right lateral extension. Bladder flap bleeding controlled with electrocautery.  Hemostasis was observed. Lavage was carried out until clear.The parietal peritoneum was closed with a running 2-0 Monocryl suture. The fascia was then reapproximated with running sutures of 0 Monocryl. The skin was reapproximated with 3-0 monocryl after [place] closure with 2-0 plain. Previous keloid scar removed.  Instrument, sponge, and needle counts were correct prior the abdominal closure and at the conclusion of the case.   Findings: Preterm female, complete breech, marginal abruption, dense bladder adhesions, omental adhesions. Right cervical extension. Bicornuate uterus.  Estimated Blood Loss:  650         Drains: foley                 Specimens: placenta to path                 Complications:  None; patient tolerated the procedure well.         Disposition: PACU - hemodynamically stable.         Condition: stable  Attending Attestation: I performed the procedure.

## 2016-07-31 NOTE — MAU Note (Signed)
Pt to OR.

## 2016-07-31 NOTE — H&P (Addendum)
OB ADMISSION/ HISTORY & PHYSICAL:  Admission Date: 07/31/2016 12:06 PM  Admit Diagnosis: Pregnancy at [redacted]w[redacted]d, vaginal bleeding and preterm contractions and in active labor  Laura Barry is a 31 y.o. female presenting for G3P1011 at [redacted]w[redacted]d w/ good prenatal care at [facility] / CNM patient.  Reports spotting noted yesterday evening, then again at midnight noted small amount on pad and some blood in toilet after voiding. Also notes cramping starting at same time, feels mostly in lower pelvis. + FM, no LOF. Reports episode of intercourse less than 24 hrs ago, first time in 2 weeks. Denies trauma to abdomen.   Genetic screening negative NT, [place] normal female anatomy with anterior placenta, no previa and normal CL. Growth sono 07/20/15 showed EFW 92%tile. .  Prenatal History: [id number]   EDC : 09/29/2016, by Other Basis   Prenatal course remarkable for hx omphalocele and C/S at 38 wks.   Prenatal Labs: ABO, Rh:   O pos Antibody:  neg Rubella:   immune RPR:   NR HBsAg:   neg HIV:   neg GBS: Positive (01/29 1213) inknown 1 hr Glucola : 89   Medical / Surgical History :  Past medical history:  Past Medical History:  Diagnosis Date  . Allergic rhinitis   . Allergy   . GERD (gastroesophageal reflux disease)      Past surgical history:  Past Surgical History:  Procedure Laterality Date  . CESAREAN SECTION    . MOUTH SURGERY    . NO PAST SURGERIES       Family History:  Family History  Problem Relation Age of Onset  . Hypertension    . Stroke    . Cancer    . Hyperlipidemia    . Anxiety disorder    . Diabetes Maternal Grandfather   . Hypertension Father   . Anxiety disorder Father      Social History:  reports that she has quit smoking. She has never used smokeless tobacco. She reports that she does not drink alcohol or use drugs.   Allergies: Promethazine    Current Medications at time of admission:  Prescriptions Prior to Admission  Medication Sig Dispense Refill Last Dose  .  Doxylamine-Pyridoxine (DICLEGIS) 10-10 MG TBEC Take 1-2 tablets by mouth 2 (two) times daily as needed (for nausea).   Past Week at Unknown time  . famotidine (PEPCID) 10 MG tablet Take 10 mg by mouth 2 (two) times daily as needed for heartburn or indigestion.    Past Week at Unknown time  . NIFEdipine (PROCARDIA) 10 MG capsule Take 1 capsule (10 mg total) by mouth every 6 (six) hours. 30 capsule 3   . Prenat-FeAsp-Meth-FA-DHA w/o A (PRENATE PIXIE) 10-0.6-0.4-200 MG CAPS Take 1 capsule by mouth daily.   Past Week at Unknown time      Review of Systems: Review of Systems  Constitutional: Negative.   All other systems reviewed and are negative.  As noted above   Physical Exam:  Dilation: 5 Exam by:: Ivonne Andrew, CNM Vitals:   07/31/16 1223  BP: 118/76  Pulse: 118    General: NAD, no apparent discomfort with ctx. Abd: Soft, NT GU: SSE with small amount dark blood in vaginal vault and aroud cervix, no lesions,no polyps at cervical os, small amount of bloody cervical mucus noted at os, no active bleeding Cvx 5/transverse, srom noted Ext: no edema   FHT 140, mod var, + accels, no decels  Ctx Q 2-4 min,, variable duration  Labs:    No results for input(s): WBC, HGB, HCT, PLT in the last 72 hours.   Assessment:  31 y.o. G3P1011 at 685w3d, hx of C/E 2/2 omphalocele for rpt csection Malpresentation- active labor  Plan:  TO OR Consent done    Consultant: Dr. Lysle DingwallFogleman    [name], CNM, MSN 07/31/2016, 1:03 PM

## 2016-07-31 NOTE — Anesthesia Postprocedure Evaluation (Signed)
Anesthesia Post Note  Patient: Laura Barry  Procedure(s) Performed: Procedure(s) (LRB): CESAREAN SECTION (N/A)  Patient location during evaluation: PACU Anesthesia Type: Spinal Level of consciousness: awake and alert Pain management: pain level controlled Vital Signs Assessment: post-procedure vital signs reviewed and stable Respiratory status: spontaneous breathing, respiratory function stable and patient connected to nasal cannula oxygen Cardiovascular status: stable Postop Assessment: no signs of nausea or vomiting, patient able to bend at knees, no headache, no backache and spinal receding Anesthetic complications: no        Last Vitals:  Vitals:   07/31/16 1549 07/31/16 1556  BP:    Pulse: 72 67  Resp: 15 16  Temp:      Last Pain:  Vitals:   07/31/16 1416  TempSrc: Oral  PainSc: 0-No pain   Pain Goal:                 Cecile HearingStephen Edward Loyola Santino

## 2016-07-31 NOTE — Progress Notes (Signed)
Pt with N/V  Emesis = 75cc clear apple juice

## 2016-07-31 NOTE — Progress Notes (Signed)
Patient ID: Laura Barry, female   DOB: [birth year], 31 y.o.   MRN: [id number]  Called by OR , ready to csection Urgent csection called. Told to hold pt until HP was entered. HP entered before patient entered OR.

## 2016-07-31 NOTE — MAU Note (Signed)
When baby was able to be traced, FHR was 140-145, confirmed with bedside ultrasound done by [place], CNM. Patient was in a lot of pain and was moving a lot in the bed and the pulse ox was coming off her finger.

## 2016-07-31 NOTE — MAU Provider Note (Signed)
Chief Complaint:  Vaginal Bleeding and Contractions   First Provider Initiated Contact with Patient 07/31/16 1221     HPI: Laura Barry is a 31 y.o. G3P1011 at 5218w3d who presents to maternity admissions reporting brown discharge and contractions since last night that changed to small amount of bright red blood this morning. Was admitted to Antenatal last week for bleeding. Received BMZ 1/28 and 1/29. No previa per US. O pos.   Location: low abd Quality: contractions Severity: 10/10 in pain scale Duration: since last night Timing: constant Modifying factors: None Associated signs and symptoms: Pos for VB. Neg for LOF.   Pregnancy Course: Previous C/S for Omphalocele.   Past Medical History:  Diagnosis Date  . Allergic rhinitis   . Allergy   . GERD (gastroesophageal reflux disease)    OB History  Gravida Para Term Preterm AB Living  3 1 1   1 1   SAB TAB Ectopic Multiple Live Births  1       1    # Outcome Date GA Lbr Len/2nd Weight Sex Delivery Anes PTL Lv  3 Current           2 SAB           1 Term     M    LIV     Complications: Omphalocele     Past Surgical History:  Procedure Laterality Date  . CESAREAN SECTION    . MOUTH SURGERY    . NO PAST SURGERIES     Family History  Problem Relation Age of Onset  . Hypertension    . Stroke    . Cancer    . Hyperlipidemia    . Anxiety disorder    . Diabetes Maternal Grandfather   . Hypertension Father   . Anxiety disorder Father    Social History  Substance Use Topics  . Smoking status: Former Games developermoker  . Smokeless tobacco: Never Used  . Alcohol use No   Allergies  Allergen Reactions  . Promethazine Rash   Prescriptions Prior to Admission  Medication Sig Dispense Refill Last Dose  . Doxylamine-Pyridoxine (DICLEGIS) 10-10 MG TBEC Take 1-2 tablets by mouth 2 (two) times daily as needed (for nausea).   Past Week at Unknown time  . famotidine (PEPCID) 10 MG tablet Take 10 mg by mouth 2 (two) times daily as needed for  heartburn or indigestion.    Past Week at Unknown time  . NIFEdipine (PROCARDIA) 10 MG capsule Take 1 capsule (10 mg total) by mouth every 6 (six) hours. 30 capsule 3   . Prenat-FeAsp-Meth-FA-DHA w/o A (PRENATE PIXIE) 10-0.6-0.4-200 MG CAPS Take 1 capsule by mouth daily.   Past Week at Unknown time    I have reviewed patient's Past Medical Hx, Surgical Hx, Family Hx, Social Hx, medications and allergies.   ROS:  Review of Systems  Constitutional: Negative for chills and fever.  Gastrointestinal: Positive for abdominal pain.  Genitourinary: Positive for vaginal bleeding. Negative for vaginal discharge.    Physical Exam   Patient Vitals for the past 24 hrs:  BP Pulse SpO2  07/31/16 1223 118/76 118 97 %   Constitutional: Well-developed, well-nourished female in severe distress.  Cardiovascular: tachycardic Respiratory: normal effort GI: Abd soft, Uterus firm, not relaxing completely btw contractions. Non-tender, gravid appropriate for gestational age.  MS: Extremities nontender, no edema, normal ROM Neurologic: Alert and oriented x 4.  GU:  Pelvic: NEFG, pos pooling, scant dark red blood, cervix not visualized. BBOW seen.  Dilation: 5.5/90/-2 Exam by:: Ivonne Andrew, CNM  FHT:  Baseline 140, moderate variability. 10x10 accels. Unable assess for decels. Very difficult to trace due to baby's position and maternal pain and mvmt. CNM at [facility] visually monitoring by [place].  Contractions: q 2-3 mins, strong   Labs: Results for orders placed or performed during the hospital encounter of 07/31/16 (from the past 24 hour(s))  CBC with Differential/Platelet     Status: Abnormal (Preliminary result)   Collection Time: 07/31/16 12:42 PM  Result Value Ref Range   WBC 13.3 (H) 4.0 - 10.5 K/uL   RBC 4.40 3.87 - 5.11 MIL/uL   Hemoglobin 13.1 12.0 - 15.0 g/dL   HCT 47.8 29.5 - 62.1 %   MCV 84.1 78.0 - 100.0 fL   MCH 29.8 26.0 - 34.0 pg   MCHC 35.4 30.0 - 36.0 g/dL   RDW 30.8 65.7 - 84.6 %   Platelets  369 150 - 400 K/uL   Neutrophils Relative % 74 %   Neutro Abs 9.7 (H) 1.7 - 7.7 K/uL   Lymphocytes Relative 21 %   Lymphs Abs 2.8 0.7 - 4.0 K/uL   Monocytes Relative 5 %   Monocytes Absolute 0.7 0.1 - 1.0 K/uL   Eosinophils Relative 0 %   Eosinophils Absolute 0.1 0.0 - 0.7 K/uL   Basophils Relative 0 %   Basophils Absolute 0.0 0.0 - 0.1 K/uL   Other PENDING %    Imaging:  Informal BS US shows fetus transverse w/ head to maternal right. CNM stood at [facility] visualizing FHR ibn 130-140's by [place] since unable to trace w/ EFM.   MAU Course: Orders Placed This Encounter  Procedures  . CBC with Differential/Platelet  . Type and screen [facility] OF   . Insert peripheral IV   Meds ordered this encounter  Medications  . lactated ringers bolus 1,000 mL  . lactated ringers bolus 1,000 mL  . ceFAZolin (ANCEF) IVPB 2g/100 mL premix    Order Specific Question:   Antibiotic Indication:    Answer:   Surgical Prophylaxis  . sodium citrate-citric acid (ORACIT) solution 30 mL  . famotidine (PEPCID) IVPB 20 mg premix  . DISCONTD: magnesium sulfate IVPB 2 g 50 mL for neuroprotection--unable to give due to advanced labor.     Donette Larry, CNM called Dr. Billy Coast who requested that she call Raelyn Mora, CNM since she was a CNM patient. When pt was to found to be transverse and in active PTL w/ PROM [place], CNM called Dr. Billy Coast back to notify him of Hx, SVE, PROM, active PTL, difficulty assessing FHR (but overall reassuring by visual assess by [place]). Asked if pt wanted TOLAC. Pt requests C/S. Prepped for OR. Dr. Debroah Loop, Faculty Practice Attending also notified in case the [facility] became worrisome or impossible to assess prior to Dr. Jorene Minors arrival.    Neonatologist notified or imminent delivery, unknown time of PROM, but most likely <18 hours w/ low suspicion for chorio, S/P BMZ.  MDM: - Active PTL w/ PROM, transverse lie. Consents to C/S. Difficulty assessing FHR, but baseline  Nml per [place]. 140's prior to transport to OR.   Assessment: 1. Premature labor in third trimester   2. Vaginal bleeding in pregnancy   3. Preterm premature rupture of membranes with onset of labor within 24 hours of rupture in third trimester   4. Transverse lie of fetus, single or unspecified fetus     Plan: Prep for OR per consult w/ Dr. Billy Coast.  [place], CNM 07/31/2016 1:42 PM

## 2016-07-31 NOTE — Lactation Note (Signed)
This note was copied from a baby's chart. Lactation Consultation Note  Patient Name: Laura Barry [id number]'YToday's Date: 07/31/2016 Reason for consult: Initial assessment;Infant < 6lbs;NICU baby   Initial consult with mom of 4 hour old NICU infant at mom's request. Mom reports she pumped for her 6918 month old for 4 months, that infant did not latch to the breast. Her 5618 month old [facility] due to Omphalo[facility]. Mom hopes to Breast feed this infant. Discussed what is normal for preterm infant and BF.   Providing Milk for Your Baby in NICU Handout given, Explained pumping schedule, what to expect and breast milk storage for NICU infant. Mom is feeling nauseated currently and has not began pump, pump is set up in room and mom reports she was shown how to pump and to clean pump parts. Showed mom how to hand express and obtained 6 cc colostrum that was sent to NICU via dad. Mom with soft compressible breasts and everted nipples. Mom reports she uses a # 27 flange, #27 flange was on the pump setup. Mom has colostrum collections containers, # stickers and BM labels at bedside.   Enc mom to pump 8-12 x in 24 hours for 15 minutes on Initiate setting followed by hand expression. Enc mom to call out to desk for assistance as needed. Bf Resources Handout and [employer] Brochure. Mom is planning to call insurance company to see if they provide a breast pump. Mom wants to get a Free Me pump, discussed with mom that a Free Me may not be able to initiate or maintain her milk supply with a preterm infant and a hospital grade pump would be recommended for at least 2 weeks and to use the hospital grade pump when visiting in NICU after the first 2 weeks.   BF Resources handout and LC Brochure given, mom informed of IP/OP Services, BF Support Groups and LC Phone #. Enc mom to call out for assistance as needed.      Maternal Data Formula Feeding for Exclusion: No Has patient been taught Hand Expression?: Yes Does the  patient have breastfeeding experience prior to this delivery?: Yes  Feeding    LATCH Score/Interventions                      Lactation Tools Discussed/Used WIC Program: No Pump Review: Setup, frequency, and cleaning;Milk Storage Initiated by:: Bedside RN   Consult Status Consult Status: Follow-up Date: 08/01/16 Follow-up type: In-patient    Silas FloodSharon S Nissim Barry 07/31/2016, 5:39 PM

## 2016-07-31 NOTE — Consult Note (Signed)
Neonatology Note:   Attendance at C-section:    I was asked by Dr. Taavon to attend this repeat C/S at 31 3/7 weeks due to SROM, onset of labor, and transverse lie. The mother is a G3P1A1 O pos, GBS positive with spotting 2/2 and question of SROM last night. She got Betamethasone on 1/28-29 and was put on Procardia due to suspect marginal placental abruption; discharged 1/30. When examined in MAU today, there was pooling of amniotic fluid.  There was plenty of clear amniotic fluid at delivery. Infant vigorous with good spontaneous cry and tone. Delayed cord clamping was done. Needed only minimal bulb suctioning, but did not pink up quickly. Pulse oximeter placed. Breath sounds with many rales; neopuff placed on infant, needing about 50% O2 to get O2 saturations into desired range. The baby had good HR and respiratory effort throughout. Ap 6/8. Shown to his parents in OR, then transported to the NICU on CPAP and 50% FIO2. His father was in attendance.   Jalilah Wiltsie C. Joniah Bednarski, MD 

## 2016-07-31 NOTE — Anesthesia Procedure Notes (Signed)
Spinal  Patient location during procedure: OR Start time: 07/31/2016 1:10 PM End time: 07/31/2016 1:12 PM Staffing Anesthesiologist: Cecile HearingURK, Alphonso Gregson EDWARD Performed: anesthesiologist  Preanesthetic Checklist Completed: patient identified, surgical consent, pre-op evaluation, timeout performed, IV checked, risks and benefits discussed and monitors and equipment checked Spinal Block Patient position: sitting Prep: site prepped and draped and DuraPrep Patient monitoring: continuous pulse ox and blood pressure Approach: midline Location: L3-4 Injection technique: single-shot Needle Needle type: Pencan  Needle gauge: 25 G Needle length: 9 cm Assessment Sensory level: T4 Additional Notes Functioning IV was confirmed and monitors were applied. Sterile prep and drape, including hand hygiene, mask and sterile gloves were used. The patient was positioned and the spine was prepped. The skin was anesthetized with lidocaine.  Free flow of clear CSF was obtained prior to injecting local anesthetic into the CSF.  The spinal needle aspirated freely following injection.  The needle was carefully withdrawn.  The patient tolerated the procedure well. Consent was obtained prior to procedure with all questions answered and concerns addressed. Risks including but not limited to bleeding, infection, nerve damage, paralysis, failed block, inadequate analgesia, allergic reaction, high spinal, itching and headache were discussed and the patient wished to proceed.   Laura AranStephen Yosef Krogh, MD

## 2016-08-01 LAB — CBC
HEMATOCRIT: 31.3 % — AB (ref 36.0–46.0)
HEMOGLOBIN: 11 g/dL — AB (ref 12.0–15.0)
MCH: 29.6 pg (ref 26.0–34.0)
MCHC: 35.1 g/dL (ref 30.0–36.0)
MCV: 84.1 fL (ref 78.0–100.0)
Platelets: 303 10*3/uL (ref 150–400)
RBC: 3.72 MIL/uL — ABNORMAL LOW (ref 3.87–5.11)
RDW: 13 % (ref 11.5–15.5)
WBC: 14 10*3/uL — AB (ref 4.0–10.5)

## 2016-08-01 NOTE — Addendum Note (Signed)
Addendum  created 08/01/16 1213 by Earmon PhoenixValerie P Jeffrie Lofstrom, CRNA   Sign clinical note

## 2016-08-01 NOTE — Plan of Care (Signed)
Problem: Role Relationship: Goal: Ability to demonstrate positive interaction with newborn will improve Outcome: Completed/Met Date Met: 08/01/16 Pt pumps and provide breast milk for baby  and also visits her baby in NICU.

## 2016-08-01 NOTE — Anesthesia Postprocedure Evaluation (Signed)
Anesthesia Post Note  Patient: Laura Barry  Procedure(s) Performed: Procedure(s) (LRB): CESAREAN SECTION (N/A)  Patient location during evaluation: Mother Baby Anesthesia Type: Spinal Level of consciousness: awake Pain management: pain level controlled Vital Signs Assessment: post-procedure vital signs reviewed and stable Respiratory status: spontaneous breathing Cardiovascular status: stable Postop Assessment: no headache, no backache, epidural receding and patient able to bend at knees Anesthetic complications: no        Last Vitals:  Vitals:   08/01/16 0807 08/01/16 1153  BP: (!) 87/53 111/70  Pulse: 75 86  Resp: 14 16  Temp: 36.9 C 36.9 C    Last Pain:  Vitals:   08/01/16 1153  TempSrc: Oral  PainSc:    Pain Goal:                 Laura Barry,Laura Barry

## 2016-08-01 NOTE — Lactation Note (Signed)
This note was copied from a baby's chart. Lactation Consultation Note  Patient Name: Laura Barry MayKati Barry ZOXWR'UToday's Date: 08/01/2016 Reason for consult: Follow-up assessment;NICU baby;Infant < 6lbs   Follow up with mom of 22 hour old NICU infant. Mom reports she is pumping every 2 hours and getting 2-4 cc. She is concerned it is not enough milk for the baby, discussed her supply is normal for age in hours. She report she has not been hand expressing, enc her to hand express post BF.   Mom is noted to have irritation around the base of her nipples, she is concerned her flanges are too small. Changed mom up to # 30 flanges and they worked well. Selena BattenKim, RN to get mom coconut oil.   Enc mom to keep pumping 8-12 x in 24 hours with a 4-5 hour stretch at night of no pumping to rest. Mom asking a lot of questions about donor milk as it has been mentioned to her, enc her to talk to NEO/NNP. Discussed that if infant doing well that is not uncommon that infant will need some type of supplement.   Enc mom to pump post visiting infant in NICU or if infant being held STS.    Maternal Data Formula Feeding for Exclusion: No Has patient been taught Hand Expression?: Yes Does the patient have breastfeeding experience prior to this delivery?: Yes  Feeding    LATCH Score/Interventions                      Lactation Tools Discussed/Used WIC Program: No Pump Review: Setup, frequency, and cleaning;Milk Storage Initiated by:: Reviewed   Consult Status Consult Status: Follow-up Date: 08/02/16 Follow-up type: In-patient    Silas FloodSharon S Christine Schiefelbein 08/01/2016, 11:29 AM

## 2016-08-01 NOTE — Progress Notes (Signed)
Patient ID: Andria FramesKati B Ang, female   DOB: 02/12/1986, 31 y.o.   MRN: [id number] Subjective: S/P Repeat Emergency Cesarean Delivery / PTL / PPROM / Complete Breech POD# 1 Information for the patient's newborn:  Nancie NeasCapps, Boy Rafia [[id number]][[id number]]  female  "Derrill MemoKato" in NICU / circ planning prior to d/c from NICU  Reports feeling more sore today. Having lots of RT shoulder pain. Feeding: bottle - in NICU Patient reports tolerating PO.  Breast symptoms: none Pain controlled with ibuprofen (OTC) and narcotic analgesics including Percocet Denies HA/SOB/C/P/N/V/dizziness. Flatus absent. No BM. She reports vaginal bleeding as normal, without clots.  She is ambulating, has not urinated since removal of Foley.     Objective:   VS:  Vitals:   08/01/16 0330 08/01/16 0351 08/01/16 0552 08/01/16 0807  BP:  100/65  (!) 87/53  Pulse:    75  Resp:    14  Temp:  99.1 F (37.3 C)  98.4 F (36.9 C)  TempSrc:  Oral  Oral  SpO2: 97% 97% 96% 96%  Weight:      Height:         Intake/Output Summary (Last 24 hours) at 08/01/16 [id number] Last data filed at 08/01/16 [id number]  Gross per 24 hour  Intake             2910 ml  Output             2925 ml  Net              -15 ml        Recent Labs  07/31/16 1242 08/01/16 0537  WBC 13.3* 14.0*  HGB 13.1 11.0*  HCT 37.0 31.3*  PLT 369 303     Blood type: --/--/O POS (02/03 1242)  Rubella:   Immune    Physical Exam:   General: alert, cooperative and no distress  CV: Regular rate and rhythm, S1S2 present or without murmur or extra heart sounds  Resp: clear  Abdomen: soft, nontender, normal bowel sounds  Incision: clean, dry, intact and skin well-approximated with suture  Uterine Fundus: firm, 1 FB below umbilicus, nontender  Lochia: minimal  Ext: extremities normal, atraumatic, no cyanosis or edema, Homans sign is negative, no sign of DVT and varicose veins noted   Assessment/Plan: 31 y.o.   POD# 1.  S/P Cesarean Delivery.  Indications: breech and PTL / PPROM /  Elective Repeat                Principal Problem:   Postpartum care following repeat cesarean delivery Indication: PTL,PPROM,Breech (2/3) Active Problems:   Previous cesarean delivery, delivered   Placental abruption   Complete breech   Preterm labor in third trimester with preterm delivery   Preterm premature rupture of membranes   Previous cesarean delivery affecting pregnancy  Doing well, stable.               Regular diet as tolerated D/C IV per unit protocol Ambulate 2-3 times in hallway Routine post-op care  Kenard GowerAWSON, Issaiah Seabrooks, M, MSN, CNM 08/01/2016, 9:39 AM

## 2016-08-02 ENCOUNTER — Encounter (HOSPITAL_COMMUNITY): Payer: Self-pay | Admitting: *Deleted

## 2016-08-02 MED ORDER — COCONUT OIL OIL: 1.0000 "application " | TOPICAL_OIL | 0 refills | Status: AC | PRN

## 2016-08-02 MED ORDER — SIMETHICONE 80 MG PO CHEW: 80.0000 mg | CHEWABLE_TABLET | Freq: Three times a day (TID) | ORAL | 0 refills | Status: AC

## 2016-08-02 MED ORDER — OXYCODONE-ACETAMINOPHEN 5-325 MG PO TABS: 1.0000 | ORAL_TABLET | ORAL | 0 refills | Status: AC | PRN

## 2016-08-02 MED ORDER — IBUPROFEN 600 MG PO TABS: 600.0000 mg | ORAL_TABLET | Freq: Four times a day (QID) | ORAL | 0 refills | Status: AC

## 2016-08-02 NOTE — Clinical Social Work Maternal (Signed)
  CLINICAL SOCIAL WORK MATERNAL/CHILD NOTE  Patient Details  Name: Laura Barry MRN: [id number] Date of Birth: 06-Jan-1986  Date:  08/02/2016  Clinical Social Worker Initiating Note:  Laurey Arrow Date/ Time Initiated:  08/02/16/1512     Child's Name:  Laura Barry   Legal Guardian:  Mother   Need for Interpreter:  None   Date of Referral:  08/02/16     Reason for Referral:  Parental Support of Premature Babies < 88 weeks/or Critically Ill babies    Referral Source:  NICU   Address:  [address] [place] [address]  Phone number:  [phone]   Household Members:  Self, Minor Children, Significant Other   Natural Supports (not living in the home):  Immediate Family, Friends, Extended Family, Parent, Chief Executive Officer Supports: None   Employment: Full-time   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 When CSW arrived, MOB and FOB were at infant's bedside.  MOB was engaging in skin to skin and FOB was demonstrating support. CSW inquired about MOB's thoughts and feelings about NICU admission. MOB expressed that MOB feels good and was expecting a NICU admission.  MOB stated that this is the family's "second rodeo" with the NICU.  MOB made CSW are that MOB's 55 month old soon also spent a few weeks in the NICU at [facility] shared NICU visitation policy and encouraged MOB and FOB to ask questions. CSW inquired about MOB's support, and MOB reported that MOB  and FOB have a wealth of support from MOB's and FOB's immediate and extended family members, churcenh family and friends. CSW assessed for MH concerns and hx and MOB denied them all. CSW educated MOB about PPD. CSW informed MOB of possible supports and interventions to decrease PPD.  CSW also encouraged MOB to seek medical attention if needed for increased signs and symptoms for PPD; MOB denied PPD with MOB's oldest son.  MOB also denied any psychosocial stressors and reported that MOB has car seats and a safe place for the infant to sleep. CSW will continue to assess family for barriers, concerns, and needed resources while infant is in the NICU. CSW provided MOB with CSW contact information.   CSW Plan/Description:  Information/Referral to [employer] , Engineer, mining , Psychosocial Support and Ongoing Assessment of Needs    Laurey Arrow, MSW, LCSW Clinical Social Work [phone]   Dimple Nanas, LCSW 08/02/2016, 3:13 PM

## 2016-08-02 NOTE — Lactation Note (Signed)
This note was copied from a baby's chart. Lactation Consultation Note  Patient Name: Laura Barry ZOXWR'UToday's Date: 08/02/2016    Mom is pumping every 3 hours and obtaining 5-10 mls of colostrum.  She states the coconut oil is helping with soreness.  2 week rental completed.  Encouraged to continue pumping 8-12 times/24 hours and call with concerns prn.   Maternal Data    Feeding Feeding Type: Breast Milk with Formula added Length of feed: 15 min  LATCH Score/Interventions                      Lactation Tools Discussed/Used     Consult Status      Huston FoleyMOULDEN, Keny Donald S 08/02/2016, 1:37 PM

## 2016-08-02 NOTE — Discharge Summary (Signed)
OB Discharge Summary     Patient Name: Laura Barry DOB: [birth year] MRN: [id number]  Date of admission: 07/31/2016 Delivering MD: Olivia Mackie   Date of discharge: 08/02/2016  Admitting diagnosis: 31 WKS, BLEEDING, CTXS Intrauterine pregnancy: [redacted]w[redacted]d     Secondary diagnosis:  Principal Problem:   Postpartum care following repeat cesarean delivery Indication: PTL,PPROM,Breech (2/3) Active Problems:   Previous cesarean delivery, delivered   Placental abruption   Complete breech   Preterm labor in third trimester with preterm delivery   Preterm premature rupture of membranes   Previous cesarean delivery affecting pregnancy     Discharge diagnosis: Preterm Pregnancy Delivered                                                                                                Post partum procedures: none  Complications: Placental Abruption  Hospital course:  Onset of Labor With Unplanned C/S  31 y.o. yo Laura Barry at [redacted]w[redacted]d was admitted in Active Labor on 07/31/2016. Patient had a labor course significant for presumed partial placenta abruption, treated at 30 wks and given betamethasone course for fetal lung maturation. Membrane Rupture Time/Date: 1:26 PM ,07/31/2016   The patient went for cesarean section due to Malpresentation, and delivered a Viable infant,07/31/2016  Details of operation can be found in separate operative note. Patient had an uncomplicated postpartum course.  She is ambulating,tolerating a regular diet, passing flatus, and urinating well.  Patient is discharged home in stable condition 08/02/16.  Physical exam  Vitals:   08/01/16 1550 08/01/16 1933 08/02/16 0813 08/02/16 0839  BP: (!) 101/54 113/67 (!) 103/59   Pulse: 81 99 67   Resp: 14 18 18 16   Temp: 98.3 F (36.8 C) 98 F (36.7 C) 97.7 F (36.5 C)   TempSrc: Oral Oral Oral   SpO2: 97% 98% 97%   Weight:      Height:       General: alert, cooperative and no distress Lochia: appropriate Uterine Fundus:  firm Incision: Healing well with no significant drainage DVT Evaluation: No cords or calf tenderness. No significant calf/ankle edema. Labs: Lab Results  Component Value Date   WBC 14.0 (H) 08/01/2016   HGB 11.0 (L) 08/01/2016   HCT 31.3 (L) 08/01/2016   MCV 84.1 08/01/2016   PLT 303 08/01/2016   No flowsheet data found.  Discharge instruction: per After Visit Summary and "Baby and Me Booklet".  After visit meds:  Allergies as of 08/02/2016      Reactions   Promethazine Rash      Medication List    TAKE these medications   coconut oil Oil Apply 1 application topically as needed.   ibuprofen 600 MG tablet Commonly known as:  ADVIL,MOTRIN Take 1 tablet (600 mg total) by mouth every 6 (six) hours.   oxyCODONE-acetaminophen 5-325 MG tablet Commonly known as:  PERCOCET/ROXICET Take 1 tablet by mouth every 4 (four) hours as needed (pain scale 4-7).   PRENATE PIXIE 10-0.6-0.4-200 MG Caps Take 1 capsule by mouth daily.   simethicone 80 MG chewable tablet Commonly known as:  MYLICON Chew 1 tablet (  80 mg total) by mouth 3 (three) times daily after meals.   STOOL SOFTENER 100 MG capsule Generic drug:  docusate sodium Take 100 mg by mouth 2 (two) times daily.       Diet: routine diet  Activity: Advance as tolerated. Pelvic rest for 6 weeks.   Outpatient follow up:6 weeks Follow up Appt:No future appointments. Follow up Visit:No Follow-up on file.  Postpartum contraception: Not Discussed  Newborn Data: Live born female Laura Barry Birth Weight: 4 lb 11.1 oz (2130 g) APGAR: 6, 8  Baby Feeding: expressed breast milk Disposition:NICU   08/02/2016 Laura Barry, CNM

## 2016-08-02 NOTE — Progress Notes (Signed)
CSW attempted to meet with MOB, however MOB was been attended to by doctor.  CSW will attempt to visit with MOB at a later time.   Blaine HamperAngel Boyd-Gilyard, MSW, LCSW Clinical Social Work [phone]

## 2016-08-02 NOTE — Progress Notes (Signed)
Subjective: POD# 2 Information for the patient's newborn:  Nancie NeasCapps, Boy Luisana [[id number]][[id number]]  female   circ planned at NICU discharge Baby name: Derrill MemoKato  Reports feeling well, desires DC home today. + R shoulder pain, improving. Feeding: breast, pumping, + colostrum Patient reports tolerating PO.  Breast symptoms: none Pain controlled with ibuprofen and Percocet Denies HA/SOB/C/P/N/V/dizziness. Flatus present, no BM. She reports vaginal bleeding as normal, without clots.  She is ambulating, urinating without difficulty.     Objective:   VS:    Vitals:   08/01/16 1550 08/01/16 1933 08/02/16 0813 08/02/16 0839  BP: (!) 101/54 113/67 (!) 103/59   Pulse: 81 99 67   Resp: 14 18 18 16   Temp: 98.3 F (36.8 C) 98 F (36.7 C) 97.7 F (36.5 C)   TempSrc: Oral Oral Oral   SpO2: 97% 98% 97%   Weight:      Height:         Intake/Output Summary (Last 24 hours) at 08/02/16 1135 Last data filed at 08/01/16 1254  Gross per 24 hour  Intake                0 ml  Output              200 ml  Net             -200 ml        Recent Labs  07/31/16 1242 08/01/16 0537  WBC 13.3* 14.0*  HGB 13.1 11.0*  HCT 37.0 31.3*  PLT 369 303     Blood type: --/--/O POS (02/03 1242)  Rubella:   immune    Physical Exam:  General: alert, cooperative and no distress Abdomen: soft, nontender, normal bowel sounds Incision: clean, dry and intact Uterine Fundus: firm, below umbilicus, nontender Lochia: minimal Ext: no edema, redness or tenderness in the calves or thighs   Assessment/Plan: 31 y.o.   POD# 2. [id number]                  Principal Problem:   Postpartum care following repeat cesarean delivery Indication: PTL,PPROM,Breech (2/3) Active Problems:   Previous cesarean delivery, delivered   Placental abruption   Complete breech   Preterm labor in third trimester with preterm delivery   Preterm premature rupture of membranes   Previous cesarean delivery affecting pregnancy   Doing well, stable.                Advance diet as tolerated Pumping q 3 hrs while awake Encourage to ambulate Routine post-op care             DC home today w/ instructions  [facility] and [facility]N   Neta Mendsaniela C Paul, CNM, MSN 08/02/2016, 11:35 AM

## 2016-08-23 ENCOUNTER — Ambulatory Visit: Payer: Self-pay

## 2016-08-23 NOTE — Lactation Note (Signed)
This note was copied from a baby's chart. Lactation Consultation Note  Patient Name: Boy Nathen MayKati Krus ZOXWR'UToday's Date: 08/23/2016 Reason for consult: Follow-up assessment;NICU baby;Infant < 6lbs   Mom is pumping and she has plenty of milk for infant and a stash in the freezer. She reports she is sleeping about 5-6 hours at night, discussed this is ok since supply is good. She reports infant is not latching well to breast. Discussed normal preterm feeding behavior and to keep allowing infant to practice and that it may be when he is around 40 weeks before he figures BF out. Mom without further questions/concerns.    Maternal Data    Feeding    LATCH Score/Interventions                      Lactation Tools Discussed/Used     Consult Status Consult Status: PRN Follow-up type: Call as needed    Ed BlalockSharon S Lyda Colcord 08/23/2016, 9:17 AM

## 2017-06-04 IMAGING — US US MFM OB LIMITED
1 series · 15 of 23 positions shown · non-contrast
Comparison: none

[Series 1: us mfm ob limited · 23 acquisitions, 15 frames shown]
[im 1/23]
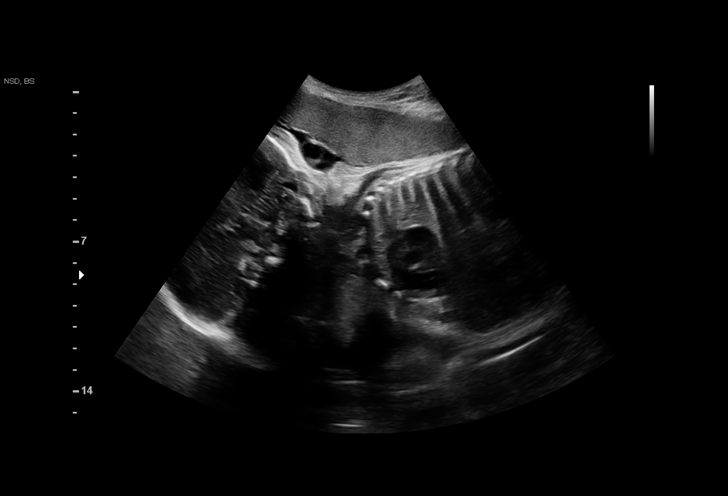
[im 3/23]
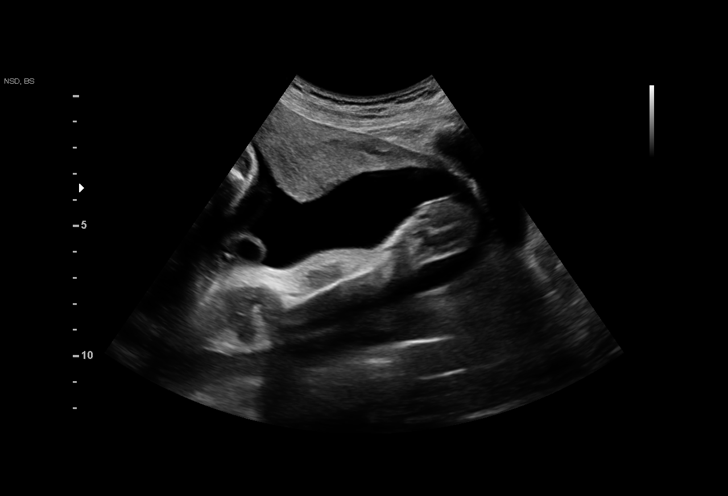
[im 4/23]
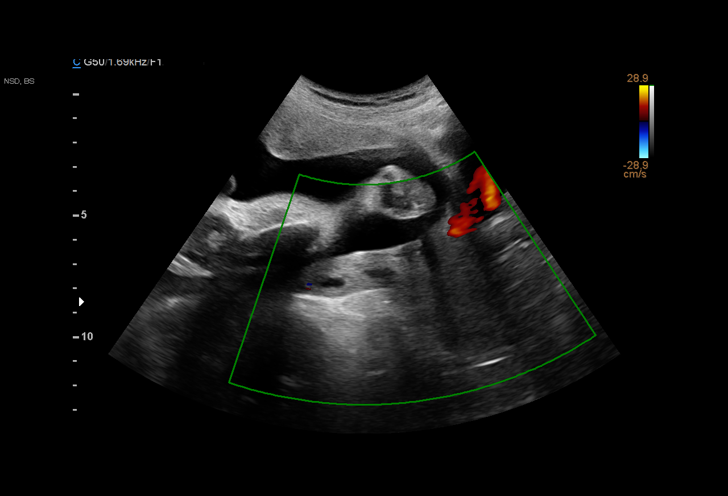
[im 6/23]
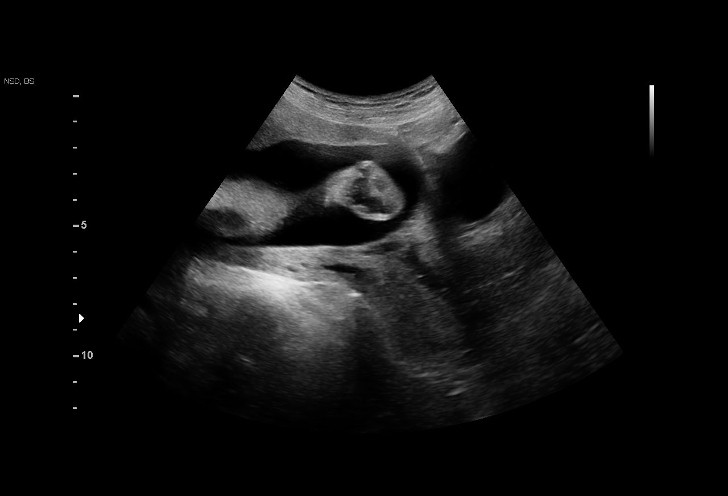
[im 7/23]
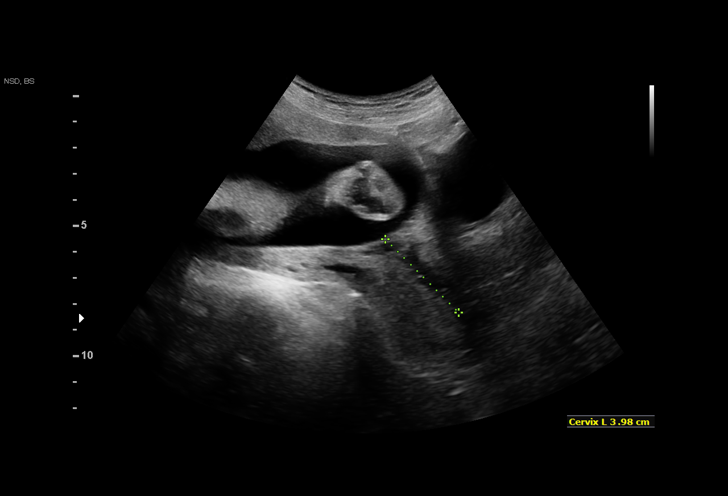
[im 9/23]
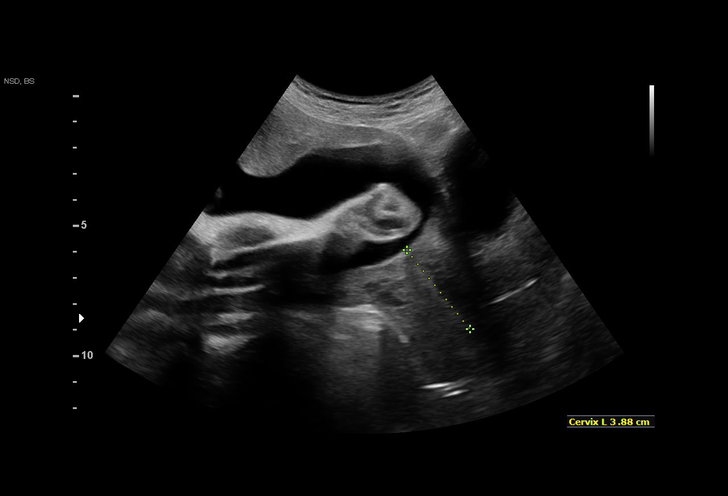
[im 10/23]
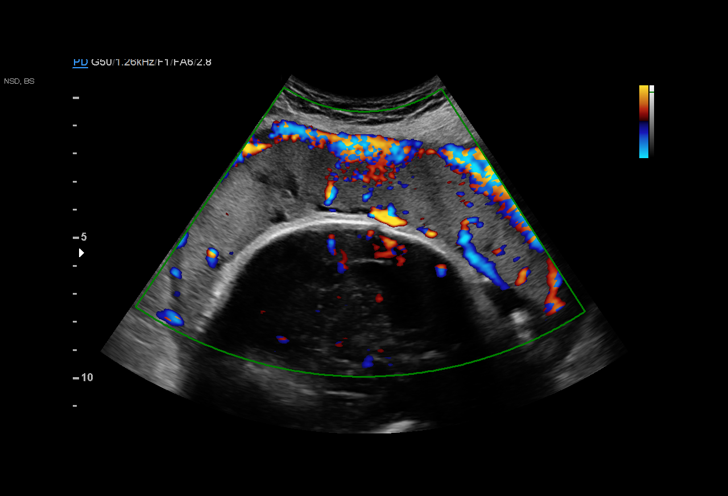
[im 12/23]
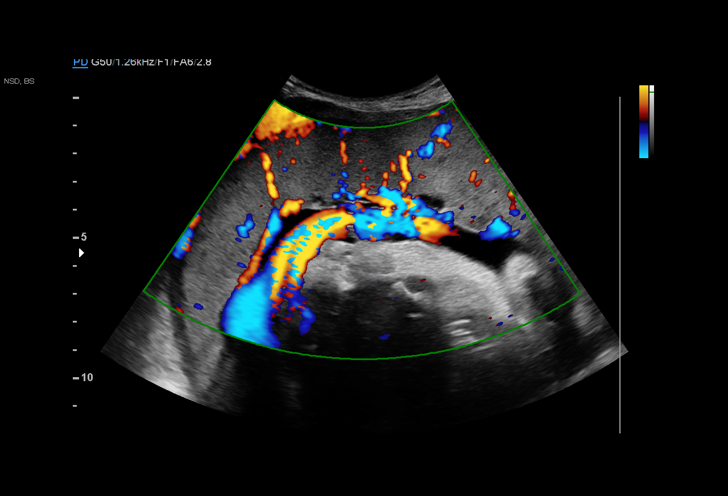
[im 14/23]
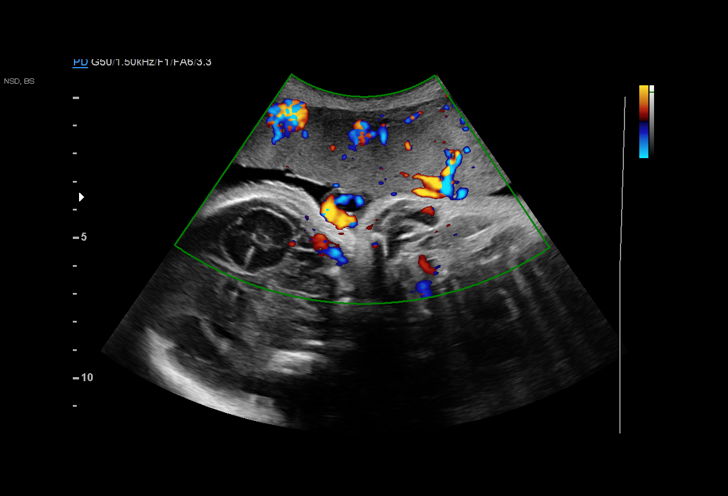
[im 15/23]
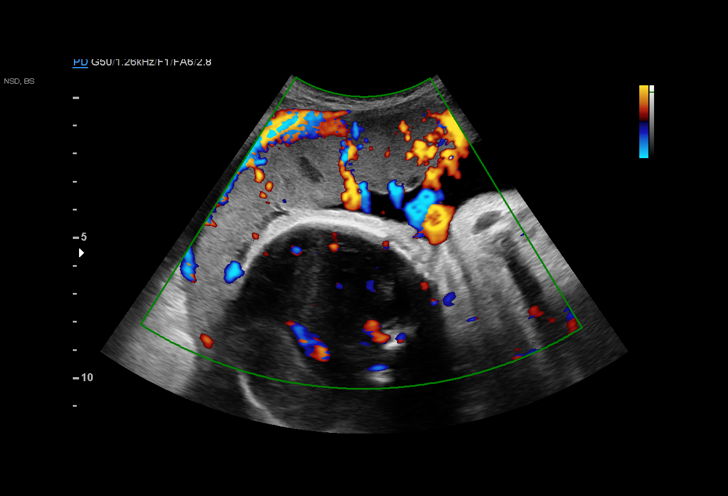
[im 17/23]
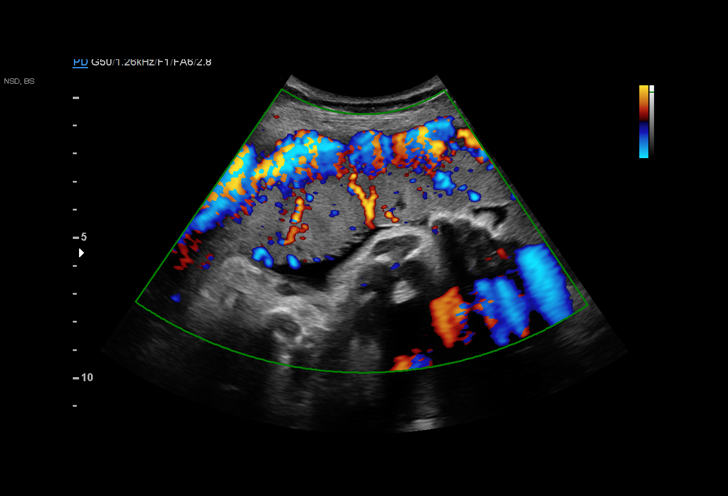
[im 18/23]
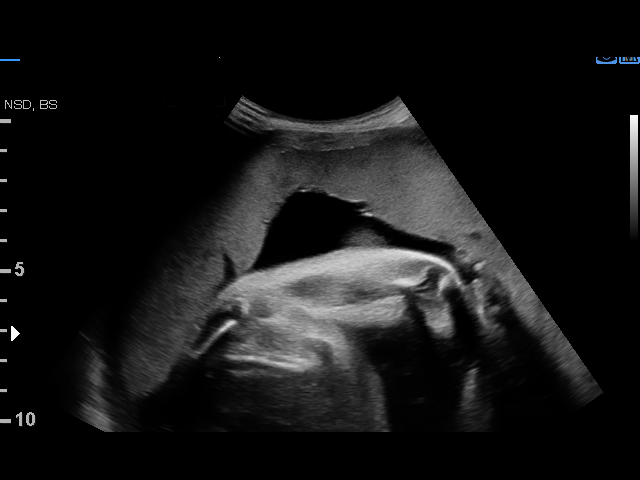
[im 20/23]
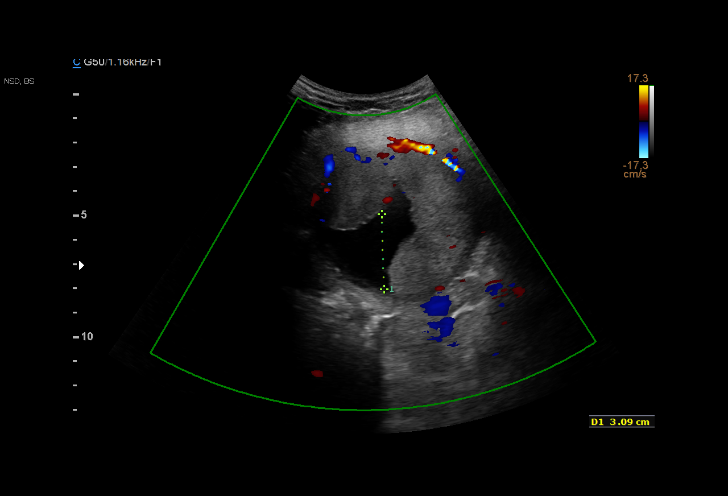
[im 21/23]
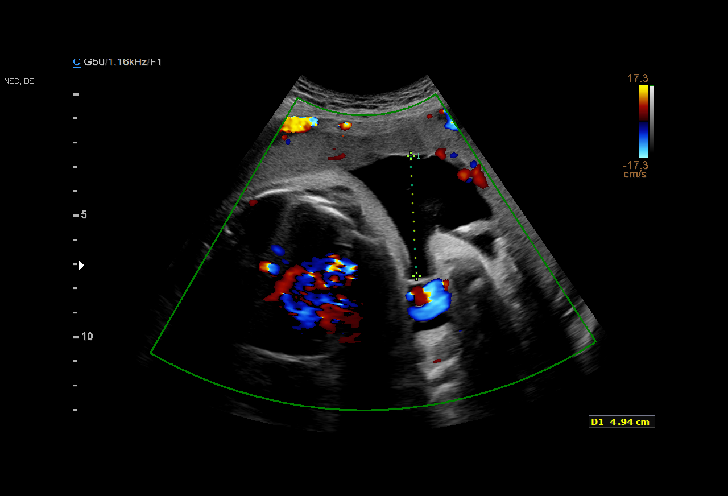
[im 23/23]
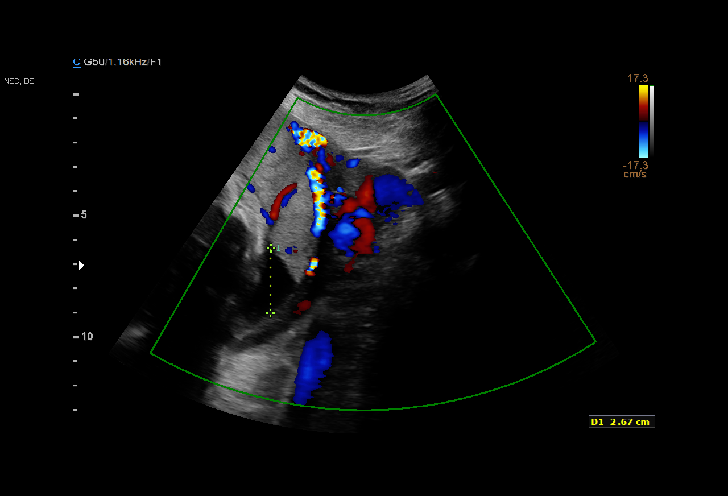

[15 of 23 positions shown; findings below may reference images not displayed]

Attending:        Shomari Ng       Secondary Phy.:    [facility] Nursing-
MAU/Triage

1  JOSE ITO CHAU             [id number]      [id number]     [id number]
Indications

30 weeks gestation of pregnancy
Vaginal bleeding in pregnancy, third trimester
Abdominal pain in pregnancy
Previous cesarean delivery, antepartum
OB History

Gravidity:    3         Term:   1        Prem:   0        SAB:   1
TOP:          0       Ectopic:  0        Living: 1
Fetal Evaluation

Num Of Fetuses:     1
Fetal Heart         158
Rate(bpm):
Cardiac Activity:   Observed
Presentation:       Transverse, head to maternal right
Placenta:           Anterior, above cervical os
P. Cord Insertion:  Visualized

Amniotic Fluid
AFI FV:      Subjectively within normal limits

AFI Sum(cm)     %Tile       Largest Pocket(cm)
17.09           63

RUQ(cm)       RLQ(cm)       LUQ(cm)        LLQ(cm)
3.09
Gestational Age
Clinical EDD:  30w 4d                                        EDD:   09/29/16
Best:          30w 4d     Det. By:  Clinical EDD             EDD:   09/29/16
Cervix Uterus Adnexa

Cervix
Length:            3.9  cm.
Normal appearance by transabdominal scan.
Impression

SIUP at 06w9d (remote read of ultrasound only)
active fetus in transverse lie
no previa
normal AFI
normal cervical length
Recommendations

Management and follow up as clinically indicated.
